# Patient Record
Sex: Male | Born: 1980 | Race: Black or African American | Hispanic: No | Marital: Single | Smoking: Current some day smoker
Health system: Southern US, Community
[De-identification: ages and names within clinical notes are randomized; demographics above are authoritative.]

---

## 2005-01-11 ENCOUNTER — Emergency Department (HOSPITAL_COMMUNITY): Admission: EM | Admit: 2005-01-11 | Discharge: 2005-01-11 | Payer: Self-pay | Admitting: Family Medicine

## 2007-06-07 ENCOUNTER — Ambulatory Visit (HOSPITAL_COMMUNITY): Admission: RE | Admit: 2007-06-07 | Discharge: 2007-06-07 | Payer: Self-pay | Admitting: Obstetrics and Gynecology

## 2008-02-13 ENCOUNTER — Emergency Department (HOSPITAL_COMMUNITY): Admission: EM | Admit: 2008-02-13 | Discharge: 2008-02-13 | Payer: Self-pay | Admitting: Emergency Medicine

## 2008-11-17 ENCOUNTER — Emergency Department (HOSPITAL_BASED_OUTPATIENT_CLINIC_OR_DEPARTMENT_OTHER): Admission: EM | Admit: 2008-11-17 | Discharge: 2008-11-17 | Payer: Self-pay | Admitting: Emergency Medicine

## 2008-12-25 ENCOUNTER — Ambulatory Visit: Payer: Self-pay | Admitting: Internal Medicine

## 2008-12-25 DIAGNOSIS — J309 Allergic rhinitis, unspecified: Secondary | ICD-10-CM | POA: Insufficient documentation

## 2008-12-25 DIAGNOSIS — R030 Elevated blood-pressure reading, without diagnosis of hypertension: Secondary | ICD-10-CM | POA: Insufficient documentation

## 2009-01-30 ENCOUNTER — Telehealth: Payer: Self-pay | Admitting: Internal Medicine

## 2009-12-05 ENCOUNTER — Emergency Department (HOSPITAL_BASED_OUTPATIENT_CLINIC_OR_DEPARTMENT_OTHER): Admission: EM | Admit: 2009-12-05 | Discharge: 2009-12-05 | Payer: Self-pay | Admitting: Emergency Medicine

## 2010-10-20 NOTE — Assessment & Plan Note (Signed)
Summary: new to be est.-jr   Vital Signs:  Patient profile:   30 year old male Height:      69 inches Weight:      190.75 pounds BMI:     28.27 Temp:     97.8 degrees F oral Pulse rate:   76 / minute Pulse rhythm:   regular Resp:     16 per minute BP sitting:   130 / 80  (left arm) Cuff size:   large  Vitals Entered By: Glendell Docker CMA (December 25, 2008 9:33 AM)  Primary Care Provider:  Dondra Spry DO   History of Present Illness: 30 y/o AA male to establish and for CPX.   He denies hx of major illness or hospitalization.  He has hx of allergies.  He is experiencing exacerbation with nasal congestion and irritated eyes.   He also has hx of chlaymdia.   He was treated.   He is not sure if he was ever tested for HIV.  Preventive Screening-Counseling & Management     Alcohol drinks/day: >5- weekend     Alcohol type: beer     Alcohol Counseling: to decrease amount and/or frequency of alcohol intake     Smoking Status: current     Packs/Day: <0.25     Year Started: 1996     Tobacco Counseling: to quit use of tobacco products     Caffeine use/day: 3 beverages daily     Caffeine Counseling: decrease use of caffeine     Does Patient Exercise: no  Allergies (verified): No Known Drug Allergies  Past History:  Past Medical History:    Allergic rhinitis    Hx of chlamydia   Family History:    Mother died of AIDS age 78    CAD - no    DM - No    Family History Hypertension    Cancer - no  Social History:    Occupation: Midwife [employer]    Single    1 daughter 4y/o    Caffeine use/day:  3 beverages daily    Smoking Status:  current    Packs/Day:  <0.25    Does Patient Exercise:  no  Physical Exam  General:  alert, well-developed, and well-nourished.   Head:  normocephalic and atraumatic.   Eyes:  vision grossly intact, pupils equal, pupils round, and pupils reactive to light.   Ears:  R ear normal and L ear normal.   Nose:  nasal  dischargemucosal pallor and mucosal edema.   Mouth:  Oral mucosa and oropharynx without lesions or exudates.  Teeth in good repair. Neck:  No deformities, masses, or tenderness noted. Lungs:  Normal respiratory effort, chest expands symmetrically. Lungs are clear to auscultation, no crackles or wheezes. Heart:  normal rate, regular rhythm, no murmur, and no gallop.   Abdomen:  soft, non-tender, no hepatomegaly, and no splenomegaly.   Genitalia:  no scrotal masses, no cutaneous lesions, and no urethral discharge.   Extremities:  No clubbing, cyanosis, edema, or deformity noted with normal full range of motion of all joints.   Neurologic:  cranial nerves II-XII intact and gait normal.   Psych:  normally interactive, good eye contact, not anxious appearing, and not depressed appearing.     Impression & Recommendations:  Problem # 1:  HEALTH MAINTENANCE EXAM (ICD-V70.0) Reviewed adult health maintenance protocols.  He has hx of chlamydia.   I suggest HIV screen.  Problem # 2:  ALLERGIC  RHINITIS (ICD-477.9)  His updated medication list for this problem includes:    Nasonex 50 Mcg/act Susp (Mometasone furoate) .Marland Kitchen... 2 sprays each nostril  Discussed use of allergy medications and environmental measures.   Complete Medication List: 1)  Nasonex 50 Mcg/act Susp (Mometasone furoate) .... 2 sprays each nostril  Patient Instructions: 1)  Please schedule a follow-up appointment in 2 months. 2)  Limit your Sodium (Salt). 3)  It is important that you exercise regularly at least 30 minutes 5 times a week. 4)  If you develop chest pain, have severe difficulty breathing, or feel very tired , stop exercising immediately and seek medical attention. 5)  BMP prior to visit, ICD-9:  6)  Hepatic Panel prior to visit, ICD-9:  7)  Lipid Panel prior to visit, ICD-9: 8)  TSH prior to visit, ICD-9: 9)  CBC w/ Diff prior to visit, ICD-9: 10)  HIV ELISA:  v70 Prescriptions: NASONEX 50 MCG/ACT SUSP  (MOMETASONE FUROATE) 2 sprays each nostril  #1 x 2   Entered and Authorized by:   D. Thomos Lemons DO   Signed by:   D. Thomos Lemons DO on 12/25/2008   Method used:   Electronically to        [facility]  [address] [phone]* (retail)       [address]       [place], [place]  [address]       Ph: [phone] or [phone]       Fax: [phone]   RxID:   [id number]

## 2010-10-20 NOTE — Progress Notes (Signed)
  Phone Note Call from Patient   Reason for Call: Talk to Nurse, Talk to Doctor Summary of Call: Pt. 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 He states he was just here and had a physical a month ago but needs someone to feel out DOT papers, so he can pick up those papers.  Can you feel this out? Call pt. 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   Follow-up by: D. Thomos Lemons DO,  Jan 30, 2009 1:12 PM  Additional Follow-up for Phone Call Additional follow up Details #1::        Called pt. to inform him that he would have to go to [facility]. Additional Follow-up by: Michaelle Copas,  Jan 30, 2009 2:59 PM

## 2010-12-14 LAB — URINALYSIS, ROUTINE W REFLEX MICROSCOPIC
Bilirubin Urine: NEGATIVE
Glucose, UA: NEGATIVE mg/dL
Hgb urine dipstick: NEGATIVE
Ketones, ur: NEGATIVE mg/dL
Nitrite: NEGATIVE
Protein, ur: NEGATIVE mg/dL
Specific Gravity, Urine: 1.014 (ref 1.005–1.030)
Urobilinogen, UA: 1 mg/dL (ref 0.0–1.0)
pH: 6.5 (ref 5.0–8.0)

## 2010-12-14 LAB — URINE CULTURE
Colony Count: NO GROWTH
Culture: NO GROWTH

## 2010-12-14 LAB — URINE MICROSCOPIC-ADD ON

## 2010-12-14 LAB — GONOCOCCUS DNA, PCR: GC Probe Amp, Genital: POSITIVE — AB

## 2011-06-16 LAB — GC/CHLAMYDIA PROBE AMP, GENITAL
Chlamydia, DNA Probe: POSITIVE — AB
GC Probe Amp, Genital: POSITIVE — AB

## 2011-06-16 LAB — RPR: RPR Ser Ql: NONREACTIVE

## 2011-10-06 ENCOUNTER — Emergency Department (HOSPITAL_BASED_OUTPATIENT_CLINIC_OR_DEPARTMENT_OTHER)
Admission: EM | Admit: 2011-10-06 | Discharge: 2011-10-06 | Disposition: A | Discharge status: Home / Self Care | Payer: PRIVATE HEALTH INSURANCE | Attending: Emergency Medicine | Admitting: Emergency Medicine

## 2011-10-06 ENCOUNTER — Encounter (HOSPITAL_BASED_OUTPATIENT_CLINIC_OR_DEPARTMENT_OTHER): Payer: Self-pay | Admitting: Emergency Medicine

## 2011-10-06 DIAGNOSIS — A088 Other specified intestinal infections: Secondary | ICD-10-CM | POA: Insufficient documentation

## 2011-10-06 DIAGNOSIS — K2970 Gastritis, unspecified, without bleeding: Secondary | ICD-10-CM

## 2011-10-06 DIAGNOSIS — R197 Diarrhea, unspecified: Secondary | ICD-10-CM | POA: Insufficient documentation

## 2011-10-06 DIAGNOSIS — A084 Viral intestinal infection, unspecified: Secondary | ICD-10-CM

## 2011-10-06 DIAGNOSIS — R109 Unspecified abdominal pain: Secondary | ICD-10-CM | POA: Insufficient documentation

## 2011-10-06 MED ORDER — GI COCKTAIL ~~LOC~~
30.0000 mL | Freq: Once | ORAL | Status: AC
  Administered 2011-10-06: 30 mL via ORAL
  Filled 2011-10-06: qty 30

## 2011-10-06 MED ORDER — ONDANSETRON 4 MG PO TBDP
4.0000 mg | ORAL_TABLET | Freq: Once | ORAL | Status: AC
  Administered 2011-10-06: 4 mg via ORAL
  Filled 2011-10-06: qty 1

## 2011-10-06 MED ORDER — FAMOTIDINE 20 MG PO TABS
20.0000 mg | ORAL_TABLET | Freq: Once | ORAL | Status: AC
  Administered 2011-10-06: 20 mg via ORAL
  Filled 2011-10-06: qty 1

## 2011-10-06 MED ORDER — FAMOTIDINE 20 MG PO TABS: 20.0000 mg | ORAL_TABLET | Freq: Two times a day (BID) | ORAL | Status: DC

## 2011-10-06 NOTE — ED Provider Notes (Signed)
History     CSN: [id number]  Arrival date & time 10/06/11  1478   First MD Initiated Contact with Patient 10/06/11 [phone]      Chief Complaint  Patient presents with  . Abdominal Pain  . Diarrhea    (Consider location/radiation/quality/duration/timing/severity/associated sxs/prior treatment) Patient is a 31 y.o. male presenting with abdominal pain. The history is provided by the patient. No language interpreter was used.  Abdominal Pain The primary symptoms of the illness include abdominal pain, nausea, vomiting and diarrhea. The primary symptoms of the illness do not include fever, fatigue, shortness of breath, hematemesis, hematochezia or dysuria. The current episode started yesterday. The onset of the illness was gradual. The problem has not changed since onset. The abdominal pain began 3 to 5 hours ago. The pain came on gradually. The abdominal pain has been gradually worsening since its onset. The abdominal pain is located in the epigastric region. The abdominal pain does not radiate. The abdominal pain is relieved by nothing.  The vomiting began yesterday. Vomiting occurred once. The emesis contains stomach contents.  The diarrhea began today. The diarrhea is watery. The diarrhea occurs 2 to 4 times per day.  Symptoms associated with the illness do not include chills, constipation, urgency or frequency.    History reviewed. No pertinent past medical history.  History reviewed. No pertinent past surgical history.  No family history on file.  History  Substance Use Topics  . Smoking status: Current Everyday Smoker  . Smokeless tobacco: Not on file  . Alcohol Use: Yes      Review of Systems  Constitutional: Negative for fever, chills, activity change, appetite change and fatigue.  HENT: Negative for congestion, sore throat, rhinorrhea, neck pain and neck stiffness.   Respiratory: Negative for cough and shortness of breath.   Cardiovascular: Negative for chest pain and  palpitations.  Gastrointestinal: Positive for nausea, vomiting, abdominal pain and diarrhea. Negative for constipation, blood in stool, hematochezia and hematemesis.  Genitourinary: Negative for dysuria, urgency, frequency and flank pain.  Neurological: Negative for dizziness, weakness, light-headedness, numbness and headaches.  All other systems reviewed and are negative.    Allergies  Review of patient's allergies indicates no known allergies.  Home Medications   Current Outpatient Rx  Name Route Sig Dispense Refill  . FAMOTIDINE 20 MG PO TABS Oral Take 1 tablet (20 mg total) by mouth 2 (two) times daily. 30 tablet 0    BP 136/71  Pulse 92  Temp(Src) 98.6 F (37 C) (Oral)  Resp 18  SpO2 99%  Physical Exam  Nursing note and vitals reviewed. Constitutional: He is oriented to person, place, and time. He appears well-developed and well-nourished. No distress.  HENT:  Head: Normocephalic and atraumatic.  Mouth/Throat: Oropharynx is clear and moist.  Eyes: Conjunctivae and EOM are normal. Pupils are equal, round, and reactive to light.  Neck: Normal range of motion. Neck supple.  Cardiovascular: Normal rate, regular rhythm, normal heart sounds and intact distal pulses.  Exam reveals no gallop and no friction rub.   No murmur heard. Pulmonary/Chest: Effort normal and breath sounds normal. No respiratory distress.  Abdominal: Soft. Bowel sounds are normal. There is no tenderness. There is no rebound and no guarding.  Musculoskeletal: Normal range of motion. He exhibits no tenderness.  Neurological: He is alert and oriented to person, place, and time. No cranial nerve deficit.  Skin: Skin is warm and dry. No rash noted.    ED Course  Procedures (including critical care time)  Labs Reviewed - No data to display No results found.   1. Viral gastroenteritis   2. Gastritis       MDM  The patient was able tolerate by mouth via oral challenge one emergency department. He  was able to tolerate Zofran ODT, Pepcid but an entire glass of water and a GI cocktail without episode of emesis. This time I feel he is safe for discharge to home to continue oral hydration at home. Instructed him to take over-the-counter Imodium as needed for his diarrhea. There is no indication for blood work at this time as I have no suspicion for intra-abdominal process. I feel his epigastric pain is secondary to gastritis from vomiting.  Instructed to follow up with his pcp and provided sx for which to return        Dayton Bailiff, MD 10/06/11 [phone]

## 2011-10-06 NOTE — ED Notes (Signed)
Pt reports vomiting x 1 episode yesterday with diarrhea persisting until 1 am. Pt now c/o epigastric pain.

## 2012-01-22 ENCOUNTER — Emergency Department (INDEPENDENT_AMBULATORY_CARE_PROVIDER_SITE_OTHER): Payer: Self-pay

## 2012-01-22 ENCOUNTER — Encounter (HOSPITAL_BASED_OUTPATIENT_CLINIC_OR_DEPARTMENT_OTHER): Payer: Self-pay | Admitting: *Deleted

## 2012-01-22 ENCOUNTER — Emergency Department (HOSPITAL_BASED_OUTPATIENT_CLINIC_OR_DEPARTMENT_OTHER)
Admission: EM | Admit: 2012-01-22 | Discharge: 2012-01-22 | Disposition: A | Discharge status: Home / Self Care | Payer: Self-pay | Attending: Emergency Medicine | Admitting: Emergency Medicine

## 2012-01-22 DIAGNOSIS — R109 Unspecified abdominal pain: Secondary | ICD-10-CM

## 2012-01-22 DIAGNOSIS — F17200 Nicotine dependence, unspecified, uncomplicated: Secondary | ICD-10-CM | POA: Insufficient documentation

## 2012-01-22 DIAGNOSIS — M549 Dorsalgia, unspecified: Secondary | ICD-10-CM | POA: Insufficient documentation

## 2012-01-22 LAB — URINALYSIS, ROUTINE W REFLEX MICROSCOPIC
Bilirubin Urine: NEGATIVE
Glucose, UA: NEGATIVE mg/dL
Hgb urine dipstick: NEGATIVE
Ketones, ur: NEGATIVE mg/dL
Leukocytes, UA: NEGATIVE
Nitrite: NEGATIVE
Protein, ur: NEGATIVE mg/dL
Specific Gravity, Urine: 1.021 (ref 1.005–1.030)
Urobilinogen, UA: 1 mg/dL (ref 0.0–1.0)
pH: 5.5 (ref 5.0–8.0)

## 2012-01-22 MED ORDER — KETOROLAC TROMETHAMINE 60 MG/2ML IM SOLN
60.0000 mg | Freq: Once | INTRAMUSCULAR | Status: AC
  Administered 2012-01-22: 60 mg via INTRAMUSCULAR
  Filled 2012-01-22: qty 2

## 2012-01-22 MED ORDER — HYDROCODONE-ACETAMINOPHEN 5-500 MG PO TABS: 1.0000 | ORAL_TABLET | Freq: Four times a day (QID) | ORAL | Status: AC | PRN

## 2012-01-22 NOTE — Discharge Instructions (Signed)
Flank Pain Flank pain refers to pain that is located on the side of the body between the upper abdomen and the back. It can be caused by many things. CAUSES  Some of the more common causes of flank pain include:  Muscle strain.   Muscle spasms.   A disease of your spine (vertebral disk disease).   A lung infection (pneumonia).   Fluid around your lungs (pulmonary edema).   A kidney infection.   Kidney stones.   A very painful skin rash on only one side of your body (shingles).   Gallbladder disease.  DIAGNOSIS  Blood tests, urine tests, and X-rays may help your caregiver determine what is wrong. TREATMENT  The treatment of pain depends on the cause. Your caregiver will determine what treatment will work best for you. HOME CARE INSTRUCTIONS   Home care will depend on the cause of your pain.   Some medications may help relieve the pain. Take medication for relief of pain as directed by your caregiver.   Tell your caregiver about any changes in your pain.   Follow up with your caregiver.  SEEK IMMEDIATE MEDICAL CARE IF:   Your pain is not controlled with medication.   The pain increases.   You have abdominal pain.   You have shortness of breath.   You have persistent nausea or vomiting.   You have swelling in your abdomen.   You feel faint or pass out.   You have a temperature by mouth above 102 F (38.9 C), not controlled by medicine.  MAKE SURE YOU:   Understand these instructions.   Will watch your condition.   Will get help right away if you are not doing well or get worse.  Document Released: 10/28/2005 Document Revised: 08/26/2011 Document Reviewed: 02/21/2010 Exit[employer] Patient Information 2012 [employer]. 

## 2012-01-22 NOTE — ED Provider Notes (Signed)
History     CSN: [id number]  Arrival date & time 01/22/12  0944   First MD Initiated Contact with Patient 01/22/12 1012      Chief Complaint  Patient presents with  . Back Pain    (Consider location/radiation/quality/duration/timing/severity/associated sxs/prior treatment) Patient is a 31 y.o. male presenting with back pain. The history is provided by the patient.  Back Pain  This is a new problem. Episode onset: yesterday morning. The problem occurs constantly. The problem has been gradually worsening. The pain is associated with no known injury. Pain location: left flank. The quality of the pain is described as cramping. The pain does not radiate. The pain is severe. The pain is the same all the time.    History reviewed. No pertinent past medical history.  History reviewed. No pertinent past surgical history.  No family history on file.  History  Substance Use Topics  . Smoking status: Current Everyday Smoker  . Smokeless tobacco: Not on file  . Alcohol Use: Yes      Review of Systems  Musculoskeletal: Positive for back pain.  All other systems reviewed and are negative.    Allergies  Review of patient's allergies indicates no known allergies.  Home Medications  No current outpatient prescriptions on file.  BP 141/72  Pulse 71  Temp 98.2 F (36.8 C)  Resp 18  SpO2 100%  Physical Exam  Nursing note and vitals reviewed. Constitutional: He is oriented to person, place, and time. He appears well-developed and well-nourished. No distress.  HENT:  Head: Normocephalic and atraumatic.  Neck: Normal range of motion. Neck supple.  Abdominal: Soft. Bowel sounds are normal.  Musculoskeletal: Normal range of motion.  Neurological: He is alert and oriented to person, place, and time.       Strength is 5/5 in ble.  Ambulatory without difficulty.  Skin: Skin is warm and dry. He is not diaphoretic.    ED Course  Procedures (including critical care time)   Labs  Reviewed  URINALYSIS, ROUTINE W REFLEX MICROSCOPIC   No results found.   No diagnosis found.    MDM  History sounded like a stone, but the ua and ct did not.  Will discharge with nsaids, pain meds.  Return prn.        Geoffery Lyons, MD 01/22/12 1054

## 2012-01-22 NOTE — ED Notes (Addendum)
Patient c/o R flank pain that started hurting yesterday morning, took ibuprofen yesterday, no relief, no injury, no n/v, no issues urinating

## 2013-02-14 ENCOUNTER — Encounter (HOSPITAL_COMMUNITY): Payer: Self-pay | Admitting: *Deleted

## 2013-02-14 ENCOUNTER — Emergency Department (HOSPITAL_COMMUNITY)
Admission: EM | Admit: 2013-02-14 | Discharge: 2013-02-14 | Disposition: A | Discharge status: Home / Self Care | Payer: BC Managed Care – PPO | Source: Home / Self Care

## 2013-02-14 DIAGNOSIS — S6000XA Contusion of unspecified finger without damage to nail, initial encounter: Secondary | ICD-10-CM

## 2013-02-14 DIAGNOSIS — S60111A Contusion of right thumb with damage to nail, initial encounter: Secondary | ICD-10-CM

## 2013-02-14 DIAGNOSIS — S6010XA Contusion of unspecified finger with damage to nail, initial encounter: Secondary | ICD-10-CM

## 2013-02-14 NOTE — ED Notes (Signed)
Pt  Reports   He   inj  His  r   Thumb     3  Weeks  Ago  He  Slammed a  Engineer, manufacturing  On it  It  Is  Not healing   He  Has  Nail  Bed  Involvement       He  Has  Not  seeken  tx for this  Injury  Yet

## 2013-02-14 NOTE — ED Provider Notes (Signed)
History     CSN: [id number]  Arrival date & time 02/14/13  1104   None     Chief Complaint  Patient presents with  . Finger Injury    (Consider location/radiation/quality/duration/timing/severity/associated sxs/prior treatment) HPI Comments: Approximately 3 weeks ago this 32 year old male accidentally slammed the car door on his left thumb. The contact point was at the base of the nail. This resulted in a subungual hematoma and a small chip off the proximal nail with in the lunula. He states there was initial pain however the pain subsided in a couple days. He has had full function and range of motion of the thumb without swelling or other limitations.   History reviewed. No pertinent past medical history.  History reviewed. No pertinent past surgical history.  History reviewed. No pertinent family history.  History  Substance Use Topics  . Smoking status: Current Every Day Smoker  . Smokeless tobacco: Not on file  . Alcohol Use: Yes      Review of Systems  Constitutional: Negative.   Respiratory: Negative.   Gastrointestinal: Negative.   Genitourinary: Negative.   Musculoskeletal: Negative for back pain, joint swelling and arthralgias.       As per HPI  Skin: Positive for wound.       As per history of present illness  Neurological: Negative for numbness.  Hematological: Does not bruise/bleed easily.  Psychiatric/Behavioral: Negative.     Allergies  Review of patient's allergies indicates no known allergies.  Home Medications  No current outpatient prescriptions on file.  BP 136/85  Pulse 62  Temp(Src) 98.4 F (36.9 C) (Oral)  Resp 18  SpO2 99%  Physical Exam  Nursing note and vitals reviewed. Constitutional: He is oriented to person, place, and time. He appears well-developed and well-nourished. No distress.  Pulmonary/Chest: Effort normal. No respiratory distress.  Musculoskeletal: Normal range of motion. He exhibits no edema and no tenderness.  The  thumb and thenar eminence and hand are nontender. He has full-strength in the palm with normal opposition full flexion and extension against resistance. No tenderness in the IP or MCP or phalanges. No swelling or  limitations in utility. This is essentially a normal musculoskeletal exam of the right thumb.  Neurological: He is alert and oriented to person, place, and time. He exhibits normal muscle tone.  Skin: Skin is warm and dry.  The nail of the right thumb is completely attached to the nail bed. There are no areas in which it can be lifted. The small chipped portion of the nail of the lunula is healing well. The subungual hematoma involves the entire nail. No other discoloration, signs of infection, drainage.  Psychiatric: He has a normal mood and affect.    ED Course  Procedures (including critical care time)  Labs Reviewed - No data to display No results found.   1. Finger nail contusion, initial encounter   2. Subungual hematoma of right thumb, initial encounter       MDM  With The nail completely attached to the nailbed, no signs of infection or drainage and No pain or tenderness probably the best action is to leave the nail attached as he is and allow it to heal without further intervention. I suspect he will be able to keep the nail, however, if he does start to lose the nail he can simply pulled off as the new nail grows beneath the current one.        Hayden Rasmussen, NP 02/14/13 1302  Onalee Hua  Jordany Russett, NP 02/14/13 1526

## 2013-02-15 NOTE — ED Provider Notes (Signed)
Medical screening examination/treatment/procedure(s) were performed by non-physician practitioner and as supervising physician I was immediately available for consultation/collaboration.   MORENO-COLL,Donni Oglesby; MD  Kemauri Musa Moreno-Coll, MD 02/15/13 1018 

## 2014-03-04 ENCOUNTER — Emergency Department (HOSPITAL_BASED_OUTPATIENT_CLINIC_OR_DEPARTMENT_OTHER)
Admission: EM | Admit: 2014-03-04 | Discharge: 2014-03-04 | Disposition: A | Discharge status: Home / Self Care | Payer: BC Managed Care – PPO | Attending: Emergency Medicine | Admitting: Emergency Medicine

## 2014-03-04 ENCOUNTER — Encounter (HOSPITAL_BASED_OUTPATIENT_CLINIC_OR_DEPARTMENT_OTHER): Payer: Self-pay | Admitting: Emergency Medicine

## 2014-03-04 DIAGNOSIS — F17200 Nicotine dependence, unspecified, uncomplicated: Secondary | ICD-10-CM | POA: Insufficient documentation

## 2014-03-04 DIAGNOSIS — J029 Acute pharyngitis, unspecified: Secondary | ICD-10-CM | POA: Insufficient documentation

## 2014-03-04 LAB — RAPID STREP SCREEN (MED CTR MEBANE ONLY): Streptococcus, Group A Screen (Direct): NEGATIVE

## 2014-03-04 MED ORDER — DEXAMETHASONE 4 MG PO TABS
10.0000 mg | ORAL_TABLET | Freq: Once | ORAL | Status: AC
  Administered 2014-03-04: 10 mg via ORAL

## 2014-03-04 MED ORDER — IBUPROFEN 800 MG PO TABS: 800.0000 mg | ORAL_TABLET | Freq: Three times a day (TID) | ORAL | Status: DC | PRN

## 2014-03-04 MED ORDER — IBUPROFEN 800 MG PO TABS
800.0000 mg | ORAL_TABLET | Freq: Once | ORAL | Status: AC
  Administered 2014-03-04: 800 mg via ORAL
  Filled 2014-03-04: qty 1

## 2014-03-04 MED ORDER — DEXAMETHASONE 4 MG PO TABS
ORAL_TABLET | ORAL | Status: AC
  Filled 2014-03-04: qty 3

## 2014-03-04 NOTE — ED Provider Notes (Signed)
CSN: [id number]     Arrival date & time 03/04/14  1115 History   First MD Initiated Contact with Patient 03/04/14 1125     Chief Complaint  Patient presents with  . Sore Throat     (Consider location/radiation/quality/duration/timing/severity/associated sxs/prior Treatment) Patient is a 33 y.o. male presenting with pharyngitis. The history is provided by the patient.  Sore Throat This is a new problem. The current episode started more than 2 days ago. The problem occurs constantly. The problem has been gradually worsening. Associated symptoms include chest pain (sharp this morning, intermittent). Pertinent negatives include no abdominal pain, no headaches and no shortness of breath. Nothing aggravates the symptoms. Nothing relieves the symptoms.    History reviewed. No pertinent past medical history. History reviewed. No pertinent past surgical history. No family history on file. History  Substance Use Topics  . Smoking status: Current Every Day Smoker  . Smokeless tobacco: Not on file  . Alcohol Use: Yes    Review of Systems  Constitutional: Negative for fever and chills.  Respiratory: Negative for shortness of breath.   Cardiovascular: Positive for chest pain (sharp this morning, intermittent).  Gastrointestinal: Negative for abdominal pain.  Neurological: Negative for headaches.  All other systems reviewed and are negative.     Allergies  Review of patient's allergies indicates no known allergies.  Home Medications   Prior to Admission medications   Not on File   BP 140/88  Pulse 74  Temp(Src) 98.5 F (36.9 C) (Oral)  Resp 16  Ht 5\' 9"  (1.753 m)  Wt 205 lb (92.987 kg)  BMI 30.26 kg/m2  SpO2 100% Physical Exam  Nursing note and vitals reviewed. Constitutional: He is oriented to person, place, and time. He appears well-developed and well-nourished. No distress.  HENT:  Head: Normocephalic and atraumatic.  Mouth/Throat: Posterior oropharyngeal erythema (mild)  present. No oropharyngeal exudate, posterior oropharyngeal edema or tonsillar abscesses.  Eyes: EOM are normal. Pupils are equal, round, and reactive to light.  Neck: Normal range of motion. Neck supple.  Cardiovascular: Normal rate and regular rhythm.  Exam reveals no friction rub.   No murmur heard. Pulmonary/Chest: Effort normal and breath sounds normal. No respiratory distress. He has no wheezes. He has no rales.  Abdominal: Soft. He exhibits no distension. There is no tenderness. There is no rebound.  Musculoskeletal: Normal range of motion. He exhibits no edema.  Neurological: He is alert and oriented to person, place, and time. No cranial nerve deficit. He exhibits normal muscle tone. Coordination normal.  Skin: Skin is warm. No rash noted. He is not diaphoretic.    ED Course  Procedures (including critical care time) Labs Review Labs Reviewed  RAPID STREP SCREEN  CULTURE, GROUP A STREP    Imaging Review No results found.   EKG Interpretation None      MDM   Final diagnoses:  Pharyngitis    78M presents with sore throat. Occasional cough. No fevers, rhinorrhea, or nasal congestion. Associated dysphagia. Mild redness in posterior throat, no exudates, no tonsillar edema. No signs of RPA, PTA.  Strep negative, given supportive care instructions for his viral pharyngitis.  Dagmar HaitWilliam Terran Hollenkamp, MD 03/04/14 [phone]

## 2014-03-04 NOTE — ED Notes (Signed)
C/o sore throat x 3-4 days

## 2014-03-04 NOTE — Discharge Instructions (Signed)
Pharyngitis Pharyngitis is redness, pain, and swelling (inflammation) of your pharynx.  CAUSES  Pharyngitis is usually caused by infection. Most of the time, these infections are from viruses (viral) and are part of a cold. However, sometimes pharyngitis is caused by bacteria (bacterial). Pharyngitis can also be caused by allergies. Viral pharyngitis may be spread from person to person by coughing, sneezing, and personal items or utensils (cups, forks, spoons, toothbrushes). Bacterial pharyngitis may be spread from person to person by more intimate contact, such as kissing.  SIGNS AND SYMPTOMS  Symptoms of pharyngitis include:   Sore throat.   Tiredness (fatigue).   Low-grade fever.   Headache.  Joint pain and muscle aches.  Skin rashes.  Swollen lymph nodes.  Plaque-like film on throat or tonsils (often seen with bacterial pharyngitis). DIAGNOSIS  Your health care provider will ask you questions about your illness and your symptoms. Your medical history, along with a physical exam, is often all that is needed to diagnose pharyngitis. Sometimes, a rapid strep test is done. Other lab tests may also be done, depending on the suspected cause.  TREATMENT  Viral pharyngitis will usually get better in 3 4 days without the use of medicine. Bacterial pharyngitis is treated with medicines that kill germs (antibiotics).  HOME CARE INSTRUCTIONS   Drink enough water and fluids to keep your urine clear or pale yellow.   Only take over-the-counter or prescription medicines as directed by your health care provider:   If you are prescribed antibiotics, make sure you finish them even if you start to feel better.   Do not take aspirin.   Get lots of rest.   Gargle with 8 oz of salt water ( tsp of salt per 1 qt of water) as often as every 1 2 hours to soothe your throat.   Throat lozenges (if you are not at risk for choking) or sprays may be used to soothe your throat. SEEK MEDICAL  CARE IF:   You have large, tender lumps in your neck.  You have a rash.  You cough up green, yellow-brown, or bloody spit. SEEK IMMEDIATE MEDICAL CARE IF:   Your neck becomes stiff.  You drool or are unable to swallow liquids.  You vomit or are unable to keep medicines or liquids down.  You have severe pain that does not go away with the use of recommended medicines.  You have trouble breathing (not caused by a stuffy nose). MAKE SURE YOU:   Understand these instructions.  Will watch your condition.  Will get help right away if you are not doing well or get worse. Document Released: 09/06/2005 Document Revised: 06/27/2013 Document Reviewed: 05/14/2013 [facility] Patient Information 2014 [employer], [place].   Emergency Department Resource Guide 1) Find a Doctor and Pay Out of Pocket Although you won't have to find out who is covered by your insurance plan, it is a good idea to ask around and get recommendations. You will then need to call the office and see if the doctor you have chosen will accept you as a new patient and what types of options they offer for patients who are self-pay. Some doctors offer discounts or will set up payment plans for their patients who do not have insurance, but you will need to ask so you aren't surprised when you get to your appointment.  2) Contact Your Local Health Department Not all health departments have doctors that can see patients for sick visits, but many do, so it is worth a  call to see if yours does. If you don't know where your local health department is, you can check in your phone book. The CDC also has a tool to help you locate your state's health department, and many state websites also have listings of all of their local health departments.  3) Find a Walk-in Clinic If your illness is not likely to be very severe or complicated, you may want to try a walk in clinic. These are popping up all over the country in pharmacies, drugstores, and  shopping centers. They're usually staffed by nurse practitioners or physician assistants that have been trained to treat common illnesses and complaints. They're usually fairly quick and inexpensive. However, if you have serious medical issues or chronic medical problems, these are probably not your best option.  No Primary Care Doctor: - Call Health Connect at  [phone] - they can help you locate a primary care doctor that  accepts your insurance, provides certain services, etc. - Physician Referral Service- [phone]  Chronic Pain Problems: Organization         Address  Phone   Notes  [facility]  [phone]) [phone] Patients need to be referred by their primary care doctor.   Medication Assistance: Organization         Address  Phone   Notes  [facility] Medication [facility] [address] [place], [place] [address] [phone]) [phone] --Must be a resident of [place] -- Must have NO insurance coverage whatsoever (no Medicaid/ Medicare, etc.) -- The pt. MUST have a primary care doctor that directs their care regularly and follows them in the community   MedAssist  [phone]   [employer]  [phone]    Agencies that provide inexpensive medical care: Organization         Address  Phone   Notes  [facility]  [phone]   [facility]    [phone]   [facility] [address] [place], [place] [address] [phone]   [facility] [place] [address], [place] [phone]   Planned Parenthood    [phone]   [facility]    [phone]   [facility]  [address], [place] Phone:  [phone]) [phone], Fax:  [phone] Hours of Operation:  9 am - 6 pm, M-F.  Also accepts Medicaid/Medicare and self-pay.  [facility]  [address], [place] Phone: [phone],  Fax: [phone]. Hours of Operation:  8:30 am - 5:30 pm, M-F.  Also accepts Medicaid and self-pay.  [facility] [place] [address], [place] Phone: [phone]   [facility] [address], [place], [place] [phone], Ext. [phone] Mondays & Thursdays: 7-9 AM.  First 15 patients are seen on a first come, first serve basis.    Medicaid-accepting [facility] Providers:  Organization         Address  Phone   Notes  [facility] [address], [place] [phone]) [phone] Also accepts self-pay patients.  [facility] [address], [place]  [phone]) [phone]   [facility] [address], [place] [phone]   [facility] [address] [place] [address], [place] [phone]) [phone]   Renaye RakersVeita Bland [address], [place] [address],  [place]   [phone] Only accepts [place] Goldman Sachsccess Medicaid patients after they have their name applied to their card.   Self-Pay (no insurance) in [facility] [place]:  Organization         Address  Phone   Notes  Sickle Cell Patients, [facility] [address], [place] [phone]   [facility] [address], [place] [phone]   [facility] [place]  [address], [place] [phone]   [facility]/Dr. Osei-Bonsu  [address] [place] [address], [place] or [phone], [place] [phone] Phone number for both [place] and [place] locations is the same.  [facility] [address], [place] [phone]) [phone]   [facility] [address] [place] [address], [place] or [address] [phone]) [phone]   [facility] [address], [place] [phone](336) [phone], phone; [phone], fax Sees patients 1st and 3rd Saturday of every month.  Must not qualify for public or private  insurance (i.e. Medicaid, Medicare, [place] Health Choice, Veterans' Benefits)  Household income should be no more than 200% of the poverty level The clinic cannot treat you if you are pregnant or think you are pregnant  Sexually transmitted diseases are not treated at the clinic.    Dental Care: Organization         Address  Phone  Notes  [facility] of [facility] [address], [place] [phone]) [phone] Accepts children up to age 31 who are enrolled in [place]iana[place]icaid or [place] Health Choice; pregnant women with a Medicaid card; and children who have applied for Medicaid or [place] Health Choice, but were declined, whose parents can pay a reduced fee at time of service.  [facility] Department of [facility] [place]  [address], [place] [phone]) [phone] Accepts children up to age 31 who are enrolled in [place]icaid or [place] Health Choice; pregnant women with a Medicaid card; and children who have applied for Medicaid or [place] Health Choice, but were declined, whose parents can pay a reduced fee at time of service.  [facility]  [address], [place] [phone] Patients are seen by appointment only. Walk-ins are not accepted. [facility] will see patients 31 years of age and older. Monday - Tuesday (8am-5pm) Most Wednesdays (8:30-5pm) $30 per visit, cash only  [facility]  [address], [facility] [phone] Patients are seen by appointment only. Walk-ins are not accepted. [facility] will see patients 31 years of age and older. One Wednesday Evening (Monthly: Volunteer Based).  $30 per visit, cash only  [employer] [facility] [facility]  [phone] for adults; Children under age 714, call Graduate Pediatric Dentistry at [phone]. Children aged 204-14, please call [phone] to request a pediatric application.  Dental services are provided in all areas of dental care  including fillings, crowns and bridges, complete and partial dentures, implants, gum treatment, root canals, and extractions. Preventive care is also provided. Treatment is provided to both adults and children. Patients are selected via a lottery and there is often a waiting list.   [facility] [address], [place]  [phone] [email or web address]   [facility] [address], [place], [place] [phone], Ext. [phone] Second and Fourth Thursday of each month,  opens at 6:30 AM; Clinic ends at 9 AM.  Patients are seen on a first-come first-served basis, and a limited number are seen during each clinic.   [facility]  [address], [place], [place] [phone]) [phone]   Eligibility Requirements You must have lived in [place], [place], or [place] counties for at least the last three months.   You cannot be eligible for state or federal sponsored National Cityhealthcare insurance, including CIGNAVeterans Administration, [place]Medicaid, or Harrah's EntertainmentMedicare.   You generally cannot be eligible for healthcare insurance through your employer.    How to apply: Eligibility screenings are held every Tuesday and Wednesday afternoon from 1:00 pm until 4:00 pm. You do not need an appointment for the interview!  [facility] [address], [place], [place] [phone]   [facility]  [phone]   [facility]  [phone]   [facility]  [phone]    Behavioral Health Resources in the Community: Intensive Outpatient Programs Organization         Address  Phone  Notes  [facility] [address], [place], [place] [phone]   [facility] [address], [place], [place] [phone]   [facility] Drug [facility] [address], [place], [place]  [phone]   [facility] [address],  [place], [place] [phone] or [phone]     Substance Abuse Resources Organization         Address  Phone  Notes  [facility] Drug Services  [phone]   [facility]  [phone]   [facility]  [phone]   [facility]  [phone]   Residential & Outpatient Substance Abuse Program  [phone]   Psychological Services Organization         Address  Phone  Notes  [facility]  [phone]- [phone]   [facility]  [phone]- [phone]   [facility] [address], [place] [phone] or [phone]    Mobile Crisis Teams Organization         Address  Phone  Notes  [facility], Mobile Crisis Care Unit  [phone]   [facility]  [address] [place], [place] [phone]   Doristine LocksSharon DeEsch [address] [place] [phone]    Self-Help/Support Groups Organization         Address  Phone             Notes  [facility] [place] - variety of support groups  336- [phone] Call for more information  Narcotics Anonymous (NA), [facility] [address], [place]  2 meetings at this location   Statisticianesidential Treatment Programs Organization         Address  Phone  Notes  [facility] [address],    [place]  [phone]   [facility]  [address], [place] [phone], [place], [place] [phone]   [facility] [address], [place] [phone] Admissions: 8am-3pm M-F  [facility] [address]    [place], [place] [phone]   [facility] [address], [place], [place] [phone]   [facility] [address]  [place], [place] [phone]   [facility] - Intensive Outpatient [address], [place], [place] [phone]   [facility] (Addiction Recovery [facility] [address]  [place], [place]  [phone] or [phone]   Residential Treatment  Services (RTS) [address] [place], [place] [phone] Accepts Medicaid  [facility] [address]  [place] [phone] Substance Abuse/Addiction Treatment   [facility] Behavioral Health Resources Organization         Address  Phone  Notes  [facility]  [phone]   Angie FavaJulie Brannon, PhD [address], [address] [place], [place]   [phone] or [phone]) [phone]   [facility]   [address] [place], [place] [phone](336) [phone]   [facility] [address], [place], [place] [phone]) [phone] Insurance/Medicaid/sponsorship through [facility]  [facility]ith [facility] Families [address]                                    [place], [place] [phone]) [phone] Therapy/tele-psych/case  [facility] [address].   [place], [place] [phone]) [phone]    Dr. Lolly MustacheArfeen  [phone]) [phone]   [facility]  [employer] [facility] 1) [address], [place] 2) [address], [place] 3)  [address], [place] [phone]  [phone]) [phone]   [facility] Child Abuse Hotline [phone]) [phone] or [phone](336) [phone] (After Hours)

## 2014-03-06 LAB — CULTURE, GROUP A STREP

## 2017-04-17 ENCOUNTER — Emergency Department (HOSPITAL_BASED_OUTPATIENT_CLINIC_OR_DEPARTMENT_OTHER): Payer: Self-pay

## 2017-04-17 ENCOUNTER — Encounter (HOSPITAL_BASED_OUTPATIENT_CLINIC_OR_DEPARTMENT_OTHER): Payer: Self-pay | Admitting: *Deleted

## 2017-04-17 ENCOUNTER — Emergency Department (HOSPITAL_BASED_OUTPATIENT_CLINIC_OR_DEPARTMENT_OTHER)
Admission: EM | Admit: 2017-04-17 | Discharge: 2017-04-17 | Disposition: A | Discharge status: Home / Self Care | Payer: Self-pay | Attending: Emergency Medicine | Admitting: Emergency Medicine

## 2017-04-17 DIAGNOSIS — W109XXA Fall (on) (from) unspecified stairs and steps, initial encounter: Secondary | ICD-10-CM | POA: Insufficient documentation

## 2017-04-17 DIAGNOSIS — Y929 Unspecified place or not applicable: Secondary | ICD-10-CM | POA: Insufficient documentation

## 2017-04-17 DIAGNOSIS — Y999 Unspecified external cause status: Secondary | ICD-10-CM | POA: Insufficient documentation

## 2017-04-17 DIAGNOSIS — Z87891 Personal history of nicotine dependence: Secondary | ICD-10-CM | POA: Insufficient documentation

## 2017-04-17 DIAGNOSIS — S63501A Unspecified sprain of right wrist, initial encounter: Secondary | ICD-10-CM | POA: Insufficient documentation

## 2017-04-17 DIAGNOSIS — Y9301 Activity, walking, marching and hiking: Secondary | ICD-10-CM | POA: Insufficient documentation

## 2017-04-17 MED ORDER — ACETAMINOPHEN 500 MG PO TABS
1000.0000 mg | ORAL_TABLET | Freq: Once | ORAL | Status: AC
  Administered 2017-04-17: 1000 mg via ORAL
  Filled 2017-04-17: qty 2

## 2017-04-17 NOTE — ED Provider Notes (Signed)
[facility] Note   CSN: [id number] Arrival date & time: 04/17/17  1755  By signing my name below, I, Linna DarnerRussell Turner, attest that this documentation has been prepared under the direction and in the presence of Graciella FreerLindsey Jefferie Holston, PA-C. Electronically Signed: Linna Darnerussell Turner, Scribe. 04/17/2017. 7:50 PM.  History   Chief Complaint Chief Complaint  Patient presents with  . Wrist Injury   The history is provided by the patient. No language interpreter was used.    HPI Comments: Brandon Bowman is a 36 y.o. male who presents to the Emergency Department for evaluation of a right wrist injury sustained s/p mechanical fall yesterday. He fell backwards down some steps and attempted to brace his fall with his outstretched right hand. No head trauma or LOC. He endorses constant right wrist pain radiating into the proximal hand as well as some swelling since the injury occurred. No medications or treatments tried. He has no prior h/o right hand or wrist injuries. Patient denies numbness/tingling, focal weakness, bruising, wounds, or any other associated symptoms.  History reviewed. No pertinent past medical history.  Patient Active Problem List   Diagnosis Date Noted  . ALLERGIC RHINITIS 12/25/2008  . ELEVATED BLOOD PRESSURE WITHOUT DIAGNOSIS OF HYPERTENSION 12/25/2008    History reviewed. No pertinent surgical history.     Home Medications    Prior to Admission medications   Medication Sig Start Date End Date Taking? Authorizing Provider  ibuprofen (ADVIL,MOTRIN) 800 MG tablet Take 1 tablet (800 mg total) by mouth every 8 (eight) hours as needed. 03/04/14   Elwin MochaWalden, Blair, MD    Family History No family history on file.  Social History Social History  Substance Use Topics  . Smoking status: Former Games developermoker  . Smokeless tobacco: Never Used  . Alcohol use Yes     Comment: occ     Allergies   Patient has no known allergies.   Review of Systems Review of  Systems  Musculoskeletal: Positive for arthralgias and joint swelling.  Skin: Negative for color change and wound.  Neurological: Negative for syncope, weakness and numbness.   Physical Exam Updated Vital Signs BP (!) 140/98 (BP Location: Right Arm)   Pulse 66   Temp 98.3 F (36.8 C) (Oral)   Resp 20   Ht 5\' 9"  (1.753 m)   Wt 90.7 kg (200 lb)   SpO2 100%   BMI 29.53 kg/m   Physical Exam  Constitutional: He is oriented to person, place, and time. He appears well-developed and well-nourished. No distress.  Sitting comfortably on examination table  HENT:  Head: Normocephalic and atraumatic.  Eyes: Conjunctivae and EOM are normal.  Neck: Neck supple. No tracheal deviation present.  Cardiovascular: Normal rate.   Pulses:      Radial pulses are 2+ on the right side, and 2+ on the left side.  Pulmonary/Chest: Effort normal. No respiratory distress.  Musculoskeletal: Normal range of motion. He exhibits tenderness.  Right wrist: TTP to ulnar aspect of the wrist that extends up to the ulnar aspect of the hand. Extension and flexion of wrist intact but he reports pain. FROM of all five digits. Opposition of thumb intact. No snuffbox tenderness.  Neurological: He is alert and oriented to person, place, and time.  Sensation intact throughout all major nerve distributions of the hand  Skin: Skin is warm and dry.  Psychiatric: He has a normal mood and affect. His behavior is normal.  Nursing note and vitals reviewed.  ED Treatments / Results  Labs (all labs ordered are listed, but only abnormal results are displayed) Labs Reviewed - No data to display  EKG  EKG Interpretation None       Radiology Dg Wrist Complete Right  Result Date: 04/17/2017 CLINICAL DATA:  36 year old male with fall and right wrist pain. EXAM: RIGHT WRIST - COMPLETE 3+ VIEW COMPARISON:  None. FINDINGS: There is no evidence of fracture or dislocation. There is no evidence of arthropathy or other focal bone  abnormality. Soft tissues are unremarkable. IMPRESSION: Negative. Electronically Signed   By: Elgie CollardArash  Radparvar M.D.   On: 04/17/2017 19:20    Procedures Procedures (including critical care time)  DIAGNOSTIC STUDIES: Oxygen Saturation is 100% on RA, normal by my interpretation.    COORDINATION OF CARE: 7:45 PM Discussed treatment plan with pt at bedside and pt agreed to plan.  Medications Ordered in ED Medications  acetaminophen (TYLENOL) tablet 1,000 mg (not administered)     Initial Impression / Assessment and Plan / ED Course  I have reviewed the triage vital signs and the nursing notes.  Pertinent labs & imaging results that were available during my care of the patient were reviewed by me and considered in my medical decision making (see chart for details).     36 year old male who presents with right wrist pain 1 day after mechanical fall. Patient is afebrile, non-toxic appearing, sitting comfortably on examination table. Vital signs reviewed and stable. Patient is neurovascularly intact. Consider fracture versus dislocation versus sprain. X-rays ordered at triage. Analgesics provided in the department. Will apply ice to the affected area in the department.  Patient x-ray negative for obvious fracture or dislocation.  Explained results to patient and discussed that there could be a ligamentous or muscular injury that cannot be picked up on XR. Pt advised to follow up with orthopedics in 1-2 weeks if no improvement in symptoms. Patient given splint while in ED, conservative therapy recommended and discussed. Patient will be discharged home & is agreeable with above plan. Returns precautions discussed. Pt appears safe for discharge.  Final Clinical Impressions(s) / ED Diagnoses   Final diagnoses:  Wrist sprain, right, initial encounter    New Prescriptions New Prescriptions   No medications on file   I personally performed the services described in this documentation, which  was scribed in my presence. The recorded information has been reviewed and is accurate.     Maxwell CaulLayden, Elora Wolter A, PA-C 04/18/17 1603    Linwood DibblesKnapp, Jon, MD 04/19/17 [phone]

## 2017-04-17 NOTE — ED Triage Notes (Signed)
Pt reports yesterday he slipped and fell down the steps at his house. Presents with R wrist pain with decreased ROM. Denies numbness/tingling; cap refill <2secs; distal pulses intact.

## 2017-04-17 NOTE — ED Notes (Signed)
PMS intact before and after. Pt tolerated well. All questions answered. 

## 2017-04-17 NOTE — Discharge Instructions (Signed)
You can take Tylenol or Ibuprofen as directed for pain.  Follow the RICE (Rest, Ice, Compression, Elevation) protocol as directed.   As we discussed, follow-up with the referred orthopedic doctor in 1-2 weeks if there is no improvement in symptoms.   Return the emergency Department for any worsening pain, redness/swelling of the wrist that began to spread down the hand or arm, fever, numbness/weakness of the hand or any other worsening or concerning symptoms.

## 2017-04-27 ENCOUNTER — Ambulatory Visit (INDEPENDENT_AMBULATORY_CARE_PROVIDER_SITE_OTHER): Payer: BLUE CROSS/BLUE SHIELD | Admitting: Family Medicine

## 2017-04-27 ENCOUNTER — Other Ambulatory Visit: Payer: Self-pay | Admitting: Family Medicine

## 2017-04-27 ENCOUNTER — Ambulatory Visit (HOSPITAL_BASED_OUTPATIENT_CLINIC_OR_DEPARTMENT_OTHER)
Admission: RE | Admit: 2017-04-27 | Discharge: 2017-04-27 | Disposition: A | Discharge status: Home / Self Care | Payer: BLUE CROSS/BLUE SHIELD | Source: Ambulatory Visit | Attending: Family Medicine | Admitting: Family Medicine

## 2017-04-27 DIAGNOSIS — R52 Pain, unspecified: Secondary | ICD-10-CM

## 2017-04-27 DIAGNOSIS — S6991XA Unspecified injury of right wrist, hand and finger(s), initial encounter: Secondary | ICD-10-CM | POA: Diagnosis not present

## 2017-04-27 DIAGNOSIS — M25531 Pain in right wrist: Secondary | ICD-10-CM | POA: Insufficient documentation

## 2017-04-28 ENCOUNTER — Encounter: Payer: Self-pay | Admitting: Family Medicine

## 2017-04-29 DIAGNOSIS — S6991XA Unspecified injury of right wrist, hand and finger(s), initial encounter: Secondary | ICD-10-CM | POA: Insufficient documentation

## 2017-04-29 NOTE — Progress Notes (Signed)
PCP: Patient, No Pcp Per  Subjective:   HPI: Patient is a 36 y.o. male here for right wrist injury.  Patient reports on 7/28 he fell down about 6-7 steps. Put right hand out to brace self and sustained FOOSH injury to right wrist. Some swelling, immediate pain volar wrist. Pain has improved since then but pain is 4/10 but up to 7/10 and sharp at worst. Using wrist brace but not needing any medicine. 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 No skin changes, numbness. Left handed.  No past medical history on file.  Current Outpatient Prescriptions on File Prior to Visit  Medication Sig Dispense Refill  . ibuprofen (ADVIL,MOTRIN) 800 MG tablet Take 1 tablet (800 mg total) by mouth every 8 (eight) hours as needed. 30 tablet 0   No current facility-administered medications on file prior to visit.     No past surgical history on file.  No Known Allergies  Social History   Social History  . Marital status: Single    Spouse name: N/A  . Number of children: N/A  . Years of education: N/A   Occupational History  . Not on file.   Social History Main Topics  . Smoking status: Former Games developermoker  . Smokeless tobacco: Never Used  . Alcohol use Yes     Comment: occ  . Drug use: No  . Sexual activity: Not on file   Other Topics Concern  . Not on file   Social History Narrative  . No narrative on file    No family history on file.  BP (!) 142/98   Pulse 61   Ht 5\' 9"  (1.753 m)   Wt 205 lb (93 kg)   BMI 30.27 kg/m   Review of Systems: See HPI above.     Objective:  Physical Exam:  Gen: NAD, comfortable in exam room  Right wrist: No gross deformity, swelling, bruising. TTP volar wrist primarily over pisiform.  No other bony tenderness. FROM wrist and digits with 5/5 strength. Negative tinels. Sensation intact to light touch.  Left wrist: FROM without pain   Assessment & Plan:  1. Right wrist injury - independently reviewed radiographs including carpal tunnel view.  No  evidence bony abnormalities.  Consistent with wrist contusion and sprain.  Wrist brace as needed but to wear at all times at work.  Icing, elevation.  Ibuprofen or aleve if needed.

## 2017-04-29 NOTE — Assessment & Plan Note (Signed)
independently reviewed radiographs including carpal tunnel view.  No evidence bony abnormalities.  Consistent with wrist contusion and sprain.  Wrist brace as needed but to wear at all times at work.  Icing, elevation.  Ibuprofen or aleve if needed.

## 2017-05-11 ENCOUNTER — Ambulatory Visit: Payer: BLUE CROSS/BLUE SHIELD | Admitting: Family Medicine

## 2017-05-17 ENCOUNTER — Ambulatory Visit: Payer: BLUE CROSS/BLUE SHIELD | Admitting: Family Medicine

## 2017-10-04 ENCOUNTER — Ambulatory Visit (HOSPITAL_BASED_OUTPATIENT_CLINIC_OR_DEPARTMENT_OTHER)
Admission: RE | Admit: 2017-10-04 | Discharge: 2017-10-04 | Disposition: A | Discharge status: Home / Self Care | Payer: BLUE CROSS/BLUE SHIELD | Source: Ambulatory Visit | Attending: Family Medicine | Admitting: Family Medicine

## 2017-10-04 ENCOUNTER — Encounter: Payer: Self-pay | Admitting: Family Medicine

## 2017-10-04 ENCOUNTER — Ambulatory Visit (INDEPENDENT_AMBULATORY_CARE_PROVIDER_SITE_OTHER): Payer: BLUE CROSS/BLUE SHIELD | Admitting: Family Medicine

## 2017-10-04 VITALS — BP 135/85 | HR 65 | Ht 69.0 in | Wt 190.0 lb

## 2017-10-04 DIAGNOSIS — S6991XA Unspecified injury of right wrist, hand and finger(s), initial encounter: Secondary | ICD-10-CM

## 2017-10-04 DIAGNOSIS — M25531 Pain in right wrist: Secondary | ICD-10-CM

## 2017-10-04 NOTE — Patient Instructions (Addendum)
You have wrist and median nerve contusions. Wear wrist brace as often as possible until this resolves (I expect 2-3 weeks). Icing 15 minutes at a time 3-4 times a day to the side of the wrist. Ibuprofen 600mg  three times a day with food OR aleve 2 tabs twice a day with food regularly for 7-10 days then as needed. Follow up with me in 4 weeks if you're still having problems.

## 2017-10-05 ENCOUNTER — Encounter: Payer: Self-pay | Admitting: Family Medicine

## 2017-10-05 NOTE — Assessment & Plan Note (Signed)
independently reviewed radiographs including carpal tunnel view and no evidence fracture.  Consistent with wrist and median nerve contusions.  Reassured.  Wrist brace.  Icing if needed.  Ibuprofen or aleve for 7-10 days then as needed.

## 2017-10-05 NOTE — Progress Notes (Signed)
PCP: Patient, No Pcp Per  Subjective:   HPI: Patient is a 37 y.o. male here for right wrist injury.  Patient reports he improved from issue last year with right wrist. But on Sunday he struck a bookcase with open right hand during [employer] game. Felt ok after this but woke up on Monday with fairly severe pain on volar side of hand/wrist on ulnar side. Been using wrist brace again and taking ibuprofen. Feels better today than yesterday. Pain level 6/10 and sharp. Associated numbness into 1st-3rd digits. Left handed. No skin changes.  Mild swelling.  History reviewed. No pertinent past medical history.  Current Outpatient Medications on File Prior to Visit  Medication Sig Dispense Refill  . ibuprofen (ADVIL,MOTRIN) 800 MG tablet Take 1 tablet (800 mg total) by mouth every 8 (eight) hours as needed. 30 tablet 0   No current facility-administered medications on file prior to visit.     History reviewed. No pertinent surgical history.  No Known Allergies  Social History   Socioeconomic History  . Marital status: Single    Spouse name: Not on file  . Number of children: Not on file  . Years of education: Not on file  . Highest education level: Not on file  Social Needs  . Financial resource strain: Not on file  . Food insecurity - worry: Not on file  . Food insecurity - inability: Not on file  . Transportation needs - medical: Not on file  . Transportation needs - non-medical: Not on file  Occupational History  . Not on file  Tobacco Use  . Smoking status: Former Games developermoker  . Smokeless tobacco: Never Used  Substance and Sexual Activity  . Alcohol use: Yes    Comment: occ  . Drug use: No  . Sexual activity: Not on file  Other Topics Concern  . Not on file  Social History Narrative  . Not on file    History reviewed. No pertinent family history.  BP 135/85   Pulse 65   Ht 5\' 9"  (1.753 m)   Wt 190 lb (86.2 kg)   BMI 28.06 kg/m   Review of Systems: See HPI  above.     Objective:  Physical Exam:  Gen: NAD, comfortable in exam room  Right wrist: Mild swelling volar side into hand.  No bruising, other deformity.  No click of TFCC. TTP over carpal tunnel and ulnar side volar hand.  No focal bony tenderness. FROM with 5/5 strength.  FROM digits also with 5/5 strength. Sensation intact to light touch. 2+ radial pulse and < 2 sec cap refill.   Assessment & Plan:  1. Right wrist injury - independently reviewed radiographs including carpal tunnel view and no evidence fracture.  Consistent with wrist and median nerve contusions.  Reassured.  Wrist brace.  Icing if needed.  Ibuprofen or aleve for 7-10 days then as needed.

## 2019-11-15 DIAGNOSIS — H1045 Other chronic allergic conjunctivitis: Secondary | ICD-10-CM | POA: Diagnosis not present

## 2019-11-15 DIAGNOSIS — J301 Allergic rhinitis due to pollen: Secondary | ICD-10-CM | POA: Diagnosis not present

## 2019-11-15 DIAGNOSIS — J3081 Allergic rhinitis due to animal (cat) (dog) hair and dander: Secondary | ICD-10-CM | POA: Diagnosis not present

## 2019-11-15 DIAGNOSIS — J3089 Other allergic rhinitis: Secondary | ICD-10-CM | POA: Diagnosis not present

## 2019-12-13 ENCOUNTER — Ambulatory Visit: Payer: BC Managed Care – PPO | Attending: Internal Medicine

## 2019-12-13 DIAGNOSIS — Z23 Encounter for immunization: Secondary | ICD-10-CM

## 2019-12-13 NOTE — Progress Notes (Signed)
   Covid-19 Vaccination Clinic  Name:  Brandon Bowman    MRN: [id number] DOB: 1981-04-21  12/13/2019  Mr. Bonfield was observed post Covid-19 immunization for 15 minutes without incident. He was provided with Vaccine Information Sheet and instruction to access the V-Safe system.   Mr. Vanderveen was instructed to call 911 with any severe reactions post vaccine: Marland Kitchen Difficulty breathing  . Swelling of face and throat  . A fast heartbeat  . A bad rash all over body  . Dizziness and weakness   Immunizations Administered    Name Date Dose VIS Date Route   Pfizer COVID-19 Vaccine 12/13/2019  2:23 PM 0.3 mL 08/31/2019 Intramuscular   Manufacturer: [employer], [employer]   Lot: [id number]   NDC: [address]-4

## 2020-01-09 ENCOUNTER — Ambulatory Visit: Payer: BC Managed Care – PPO | Attending: Internal Medicine

## 2020-01-09 DIAGNOSIS — Z23 Encounter for immunization: Secondary | ICD-10-CM

## 2020-01-09 NOTE — Progress Notes (Signed)
   Covid-19 Vaccination Clinic  Name:  Brandon Bowman    MRN: [id number] DOB: 12-19-1980  01/09/2020  [name] was observed post Covid-19 immunization for 15 minutes without incident. He was provided with Vaccine Information Sheet and instruction to access the V-Safe system.   [name] was instructed to call 911 with any severe reactions post vaccine: Marland Kitchen Difficulty breathing  . Swelling of face and throat  . A fast heartbeat  . A bad rash all over body  . Dizziness and weakness   Immunizations Administered    Name Date Dose VIS Date Route   Pfizer COVID-19 Vaccine 01/09/2020  1:59 PM 0.3 mL 11/14/2018 Intramuscular   Manufacturer: [employer], [employer]   Lot: [id number]   NDC: [address]-9

## 2020-01-30 ENCOUNTER — Other Ambulatory Visit: Payer: Self-pay

## 2020-01-30 ENCOUNTER — Encounter (HOSPITAL_BASED_OUTPATIENT_CLINIC_OR_DEPARTMENT_OTHER): Payer: Self-pay

## 2020-01-30 ENCOUNTER — Emergency Department (HOSPITAL_BASED_OUTPATIENT_CLINIC_OR_DEPARTMENT_OTHER)
Admission: EM | Admit: 2020-01-30 | Discharge: 2020-01-30 | Disposition: A | Discharge status: Home / Self Care | Payer: BC Managed Care – PPO | Attending: Emergency Medicine | Admitting: Emergency Medicine

## 2020-01-30 DIAGNOSIS — R519 Headache, unspecified: Secondary | ICD-10-CM | POA: Diagnosis not present

## 2020-01-30 DIAGNOSIS — I10 Essential (primary) hypertension: Secondary | ICD-10-CM | POA: Diagnosis not present

## 2020-01-30 DIAGNOSIS — F17290 Nicotine dependence, other tobacco product, uncomplicated: Secondary | ICD-10-CM | POA: Diagnosis not present

## 2020-01-30 DIAGNOSIS — G44209 Tension-type headache, unspecified, not intractable: Secondary | ICD-10-CM | POA: Diagnosis not present

## 2020-01-30 LAB — CBC
HCT: 41.2 % (ref 39.0–52.0)
Hemoglobin: 14.4 g/dL (ref 13.0–17.0)
MCH: 28.2 pg (ref 26.0–34.0)
MCHC: 35 g/dL (ref 30.0–36.0)
MCV: 80.8 fL (ref 80.0–100.0)
Platelets: 240 10*3/uL (ref 150–400)
RBC: 5.1 MIL/uL (ref 4.22–5.81)
RDW: 12.9 % (ref 11.5–15.5)
WBC: 5.1 10*3/uL (ref 4.0–10.5)
nRBC: 0 % (ref 0.0–0.2)

## 2020-01-30 LAB — BASIC METABOLIC PANEL
Anion gap: 8 (ref 5–15)
BUN: 18 mg/dL (ref 6–20)
CO2: 23 mmol/L (ref 22–32)
Calcium: 8.7 mg/dL — ABNORMAL LOW (ref 8.9–10.3)
Chloride: 103 mmol/L (ref 98–111)
Creatinine, Ser: 1.16 mg/dL (ref 0.61–1.24)
GFR calc Af Amer: >60 mL/min (ref >60)
GFR calc non Af Amer: >60 mL/min (ref >60)
Glucose, Bld: 107 mg/dL — ABNORMAL HIGH (ref 70–99)
Potassium: 3.5 mmol/L (ref 3.5–5.1)
Sodium: 134 mmol/L — ABNORMAL LOW (ref 135–145)

## 2020-01-30 MED ORDER — AMLODIPINE BESYLATE 5 MG PO TABS: 5.0000 mg | ORAL_TABLET | Freq: Every day | ORAL | 0 refills | Status: DC

## 2020-01-30 MED ORDER — DEXAMETHASONE SODIUM PHOSPHATE 4 MG/ML IJ SOLN
4.0000 mg | Freq: Once | INTRAMUSCULAR | Status: AC
  Administered 2020-01-30: 4 mg via INTRAVENOUS
  Filled 2020-01-30: qty 1

## 2020-01-30 MED ORDER — METOCLOPRAMIDE HCL 5 MG/ML IJ SOLN
10.0000 mg | Freq: Once | INTRAMUSCULAR | Status: AC
  Administered 2020-01-30: 10 mg via INTRAVENOUS
  Filled 2020-01-30: qty 2

## 2020-01-30 MED ORDER — AMLODIPINE BESYLATE 5 MG PO TABS
5.0000 mg | ORAL_TABLET | Freq: Once | ORAL | Status: AC
  Administered 2020-01-30: 5 mg via ORAL
  Filled 2020-01-30: qty 1

## 2020-01-30 MED ORDER — DIPHENHYDRAMINE HCL 50 MG/ML IJ SOLN
12.5000 mg | Freq: Once | INTRAMUSCULAR | Status: AC
  Administered 2020-01-30: 12.5 mg via INTRAVENOUS
  Filled 2020-01-30: qty 1

## 2020-01-30 MED ORDER — KETOROLAC TROMETHAMINE 15 MG/ML IJ SOLN
15.0000 mg | Freq: Once | INTRAMUSCULAR | Status: AC
  Administered 2020-01-30: 15 mg via INTRAVENOUS
  Filled 2020-01-30: qty 1

## 2020-01-30 NOTE — ED Triage Notes (Addendum)
Pt c/o HA x 2 days-started after a flight-denies injury-denies fever flu sx-NAD-steady gait

## 2020-01-30 NOTE — ED Provider Notes (Signed)
[facility] EMERGENCY DEPARTMENT Provider Note   CSN: [id number] Arrival date & time: 01/30/20  1618     History Chief Complaint  Patient presents with  . Headache    Brandon Bowman is a 39 y.o. male with a history of hypertension presented to ED with a headache.  He reports gradual onset of a headache approximately 2 days ago.  He was at rest when it started.  He describes as a deep throbbing inside his head, not localized to any particular part of his skull.  He reports he tried taking Tylenol yesterday, as well as aspirin and Motrin, and did have relief.  Denies fevers, chills, vomiting.  Denies numbness or weakness of the extremities.  He is not on anti-HTN medications currently and has no PCP.  He does have a BP cuff at home, and his GF was concerned his pressure was high (about 170's systolic/90's diastolic) for the past 2 days, encouraging him to come to the ED.  No CP, abdominal pain, dysuria, hematuria. NO other significant medical problems.  He does currently have a headache 7/10 intensity.   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   No family hx of aneurysm or brain aneurysm   No hx of DVT or PE No baseline meds NKDA  HPI     History reviewed. No pertinent past medical history.  Patient Active Problem List   Diagnosis Date Noted  . Right wrist injury, initial encounter 04/29/2017  . ALLERGIC RHINITIS 12/25/2008  . ELEVATED BLOOD PRESSURE WITHOUT DIAGNOSIS OF HYPERTENSION 12/25/2008    History reviewed. No pertinent surgical history.     No family history on file.  Social History   Tobacco Use  . Smoking status: Current Some Day Smoker    Types: Cigars  . Smokeless tobacco: Never Used  Substance Use Topics  . Alcohol use: Yes    Comment: daily  . Drug use: No    Home Medications Prior to Admission medications   Medication Sig Start Date End Date Taking? Authorizing Provider  amLODipine (NORVASC) 5 MG  tablet Take 1 tablet (5 mg total) by mouth daily. 01/31/20 04/30/20  Terald Sleeper, MD  ibuprofen (ADVIL,MOTRIN) 800 MG tablet Take 1 tablet (800 mg total) by mouth every 8 (eight) hours as needed. 03/04/14   Elwin Mocha, MD    Allergies    Patient has no known allergies.  Review of Systems   Review of Systems  Constitutional: Negative for chills and fever.  HENT: Negative for ear pain and sore throat.   Eyes: Negative for pain and visual disturbance.  Respiratory: Negative for cough and shortness of breath.   Cardiovascular: Negative for chest pain and palpitations.  Gastrointestinal: Negative for abdominal pain, nausea and vomiting.  Genitourinary: Negative for dysuria and hematuria.  Musculoskeletal: Negative for arthralgias and back pain.  Skin: Negative for color change and rash.  Neurological: Positive for headaches. Negative for dizziness, seizures, syncope, weakness, light-headedness and numbness.  Psychiatric/Behavioral: Negative for agitation and confusion.  All other systems reviewed and are negative.   Physical Exam Updated Vital Signs BP (!) 141/84   Pulse (!) 55   Temp 97.9 F (36.6 C) (Oral)   Resp 16   Ht 5\' 9"  (1.753 m)   Wt 95.7 kg   SpO2 97%   BMI 31.16 kg/m   Physical Exam Vitals and nursing note reviewed.  Constitutional:      Appearance: He is well-developed.  HENT:     Head: Normocephalic and atraumatic.  Eyes:     Conjunctiva/sclera: Conjunctivae normal.  Cardiovascular:     Rate and Rhythm: Normal rate and regular rhythm.     Heart sounds: No murmur.  Pulmonary:     Effort: Pulmonary effort is normal. No respiratory distress.     Breath sounds: Normal breath sounds.  Abdominal:     Palpations: Abdomen is soft.     Tenderness: There is no abdominal tenderness.  Musculoskeletal:     Cervical back: Normal range of motion and neck supple. No rigidity.  Skin:    General: Skin is warm and dry.  Neurological:     Mental Status: He is  alert.     GCS: GCS eye subscore is 4. GCS verbal subscore is 5. GCS motor subscore is 6.     Cranial Nerves: No cranial nerve deficit or dysarthria.     Sensory: No sensory deficit.     Motor: No weakness.  Psychiatric:        Mood and Affect: Mood normal.        Behavior: Behavior normal.     ED Results / Procedures / Treatments   Labs (all labs ordered are listed, but only abnormal results are displayed) Labs Reviewed  BASIC METABOLIC PANEL - Abnormal; Notable for the following components:      Result Value   Sodium 134 (*)    Glucose, Bld 107 (*)    Calcium 8.7 (*)    All other components within normal limits  CBC    EKG None  Radiology No results found.  Procedures Procedures (including critical care time)  Medications Ordered in ED Medications  ketorolac (TORADOL) 15 MG/ML injection 15 mg (15 mg Intravenous Given 01/30/20 1729)  metoCLOPramide (REGLAN) injection 10 mg (10 mg Intravenous Given 01/30/20 1729)  dexamethasone (DECADRON) injection 4 mg (4 mg Intravenous Given 01/30/20 1732)  diphenhydrAMINE (BENADRYL) injection 12.5 mg (12.5 mg Intravenous Given 01/30/20 1732)  amLODipine (NORVASC) tablet 5 mg (5 mg Oral Given 01/30/20 1726)    ED Course  I have reviewed the triage vital signs and the nursing notes.  Pertinent labs & imaging results that were available during my care of the patient were reviewed by me and considered in my medical decision making (see chart for details).  39 yo male here with headache for 2 days, concern for elevated BP at home.  Currently asymptomatic.  His BP is elevated on arrival, consistent with prior ED visits.  I suspect he may be chronically elevated, and we discussed at length the importance of BP control.  I can initiate amlodipine here but he needs to establish care with a PCP and keep a daily log at home.  We also discussed reducing the sodium in his diet, as he eats lots of fast food and processed foods.  Labs ordered to  evaluate kidney function (Cr 1.16), electrolytes wnl, and no sign of anemia.  No leukocytosis, fever, or neck stiffness to suggest meningitis.  Highly doubtful of SAH or brain bleed given he is asymptomatic now.  Doubtful of sinus infection on exam.   This may be a viral illness.  I ordered an IV migraine cocktail including toradol, benadryl, decadron.  ON reassessment his headache had dramatically improved and was gone.  Clinical Course as of Jan 30 1013  Wed Jan 30, 2020  1823 Patient reports is feeling significantly better.  His girlfriend is now present at the bedside.  We discussed  dietary modifications to help with his blood pressure, less fast food and processed food high in sodium.  I will start him on amlodipine.  He can follow-up in the wellness clinic.   [MT]    Clinical Course User Index [MT] Terald Sleeper, MD    Final Clinical Impression(s) / ED Diagnoses Final diagnoses:  Hypertension, unspecified type  Nonintractable headache, unspecified chronicity pattern, unspecified headache type    Rx / DC Orders ED Discharge Orders         Ordered    amLODipine (NORVASC) 5 MG tablet  Daily     01/30/20 1825           Terald Sleeper, MD 01/31/20 1014

## 2020-01-30 NOTE — Discharge Instructions (Signed)
Keep a daily log of your blood pressures at home.

## 2020-01-31 NOTE — Progress Notes (Addendum)
01/31/2020 506 pm TOC CM spoke to pt and agreeable to CM arranging PCP appt for follow up and establish care for HTN. Pt states he has family history of HTN. 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 Explained he can go to the [employer] for provider list or call number on card for provider list. Appt scheduled for 02/14/2020 at 2 pm. Explained to pt to call and reschedule if he cannot make appt. ED provider updated.  Pt states he did get medication filled at pharmacy. Explained to take medications as prescribed. Isidoro Donning RN CCM, [facility] ED TOC CM [phone]

## 2020-02-13 ENCOUNTER — Other Ambulatory Visit: Payer: Self-pay

## 2020-02-14 ENCOUNTER — Ambulatory Visit: Payer: BC Managed Care – PPO | Admitting: Family Medicine

## 2020-02-28 ENCOUNTER — Other Ambulatory Visit: Payer: Self-pay

## 2020-02-28 ENCOUNTER — Encounter: Payer: Self-pay | Admitting: Family Medicine

## 2020-02-28 ENCOUNTER — Ambulatory Visit (INDEPENDENT_AMBULATORY_CARE_PROVIDER_SITE_OTHER): Payer: BC Managed Care – PPO | Admitting: Family Medicine

## 2020-02-28 VITALS — BP 142/88 | HR 74 | Temp 97.4°F | Ht 68.0 in | Wt 211.4 lb

## 2020-02-28 DIAGNOSIS — I10 Essential (primary) hypertension: Secondary | ICD-10-CM | POA: Diagnosis not present

## 2020-02-28 MED ORDER — AMLODIPINE BESYLATE 10 MG PO TABS: 10.0000 mg | ORAL_TABLET | Freq: Every day | ORAL | 0 refills | Status: DC

## 2020-02-28 NOTE — Progress Notes (Signed)
New Patient Office Visit  Subjective:  Patient ID: Brandon Bowman, male    DOB: April 21, 1981  Age: 39 y.o. MRN: [id number]  CC:  Chief Complaint  Patient presents with  . New Patient (Initial Visit)    Patient is here today as a new patient to establish care.  . Hypertension    He is also here today to discuss his high BP. States that he went to ER 3-weeks-ago and they started him on Amlodipine which he has been taking since.  He says "I can tell it's better but not where it is supposed to be."    HPI Brandon Bowman presents for follow-up of hypertension.  History of elevated blood pressure that has not been treated in the past until recently when he had presented to the emergency room with a headache.  Patient was started on amlodipine 5 mg and advised to follow-up with PCP.  He is a truck driver requires a CDL.  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  He has been taking the 5 mg of amlodipine over the last 3 to 4 weeks.  Pressure has been running in the 140s over upper 80s.  He is having no issues taking the medication.  Denies swelling in his lower extremities.  Positive family history of hypertension on both sides.  He enjoys 2-3 alcoholic servings daily.  He smokes cigars over the weekends.  He does consume a lot of fast food.  He does does not does not use street drugs.  BMP and CBC drawn in the emergency room were essentially normal.  No past medical history on file.  No past surgical history on file.  No family history on file.  Social History   Socioeconomic History  . Marital status: Single    Spouse name: Not on file  . Number of children: Not on file  . Years of education: Not on file  . Highest education level: Not on file  Occupational History  . Not on file  Tobacco Use  . Smoking status: Current Some Day Smoker    Types: Cigars  . Smokeless tobacco: Never Used  Vaping Use  . Vaping Use: Never used    Substance and Sexual Activity  . Alcohol use: Yes    Comment: daily  . Drug use: No  . Sexual activity: Not on file  Other Topics Concern  . Not on file  Social History Narrative  . Not on file   Social Determinants of Health   Financial Resource Strain:   . Difficulty of Paying Living Expenses:   Food Insecurity:   . Worried About Charity fundraiser in the Last Year:   . Arboriculturist in the Last Year:   Transportation Needs:   . Film/video editor (Medical):   Marland Kitchen Lack of Transportation (Non-Medical):   Physical Activity:   . Days of Exercise per Week:   . Minutes of Exercise per Session:   Stress:   . Feeling of Stress :   Social Connections:   . Frequency of Communication with Friends and Family:   . Frequency of Social Gatherings with Friends and Family:   . Attends Religious Services:   . Active Member of Clubs or Organizations:   . Attends Archivist Meetings:   Marland Kitchen Marital Status:   Intimate Partner Violence:   . Fear of Current or Ex-Partner:   . Emotionally Abused:   .  Physically Abused:   . Sexually Abused:     ROS Review of Systems  Constitutional: Negative.   HENT: Negative.   Eyes: Negative for photophobia and visual disturbance.  Respiratory: Negative.   Cardiovascular: Negative.   Gastrointestinal: Negative.   Genitourinary: Negative.   Neurological: Positive for headaches. Negative for tremors, speech difficulty and light-headedness.  Hematological: Does not bruise/bleed easily.  Psychiatric/Behavioral: Negative.    Depression screen [facility] 2/9 02/28/2020  Decreased Interest 0  Down, Depressed, Hopeless 0  PHQ - 2 Score 0  Altered sleeping 0  Tired, decreased energy 0  Change in appetite 0  Feeling bad or failure about yourself  0  Trouble concentrating 0  Moving slowly or fidgety/restless 0  Suicidal thoughts 0  PHQ-9 Score 0  Difficult doing work/chores Not difficult at all    Objective:   Today's Vitals: BP (!) 142/88  (BP Location: Left Arm, Patient Position: Sitting, Cuff Size: Normal)   Pulse 74   Temp (!) 97.4 F (36.3 C) (Temporal)   Ht 5\' 8"  (1.727 m)   Wt 211 lb 6.4 oz (95.9 kg)   SpO2 97%   BMI 32.14 kg/m   Physical Exam Vitals and nursing note reviewed.  Constitutional:      General: He is not in acute distress.    Appearance: Normal appearance. He is not ill-appearing, toxic-appearing or diaphoretic.  HENT:     Head: Normocephalic and atraumatic.     Right Ear: Tympanic membrane, ear canal and external ear normal.     Left Ear: Tympanic membrane, ear canal and external ear normal.     Nose: Nose normal.     Mouth/Throat:     Mouth: Mucous membranes are dry.     Pharynx: Oropharynx is clear.  Eyes:     General: No scleral icterus.       Right eye: No discharge.        Left eye: No discharge.     Extraocular Movements: Extraocular movements intact.     Conjunctiva/sclera: Conjunctivae normal.     Pupils: Pupils are equal, round, and reactive to light.  Cardiovascular:     Rate and Rhythm: Normal rate and regular rhythm.  Pulmonary:     Effort: Pulmonary effort is normal.     Breath sounds: Normal breath sounds.  Abdominal:     General: Bowel sounds are normal.  Musculoskeletal:     Cervical back: No rigidity or tenderness.     Right lower leg: No edema.     Left lower leg: No edema.  Lymphadenopathy:     Cervical: No cervical adenopathy.  Skin:    General: Skin is warm and dry.  Neurological:     Mental Status: He is alert and oriented to person, place, and time.  Psychiatric:        Mood and Affect: Mood normal.        Behavior: Behavior normal.     Assessment & Plan:   Problem List Items Addressed This Visit    None    Visit Diagnoses    Essential hypertension    -  Primary   Relevant Medications   amLODipine (NORVASC) 10 MG tablet      Outpatient Encounter Medications as of 02/28/2020  Medication Sig  . ibuprofen (ADVIL,MOTRIN) 800 MG tablet Take 1 tablet  (800 mg total) by mouth every 8 (eight) hours as needed.  . [DISCONTINUED] amLODipine (NORVASC) 5 MG tablet Take 1 tablet (5 mg total) by mouth daily.  04/29/2020  amLODipine (NORVASC) 10 MG tablet Take 1 tablet (10 mg total) by mouth daily.   No facility-administered encounter medications on file as of 02/28/2020.    Follow-up: Return in about 3 months (around 05/30/2020).   Increased amlodipine to 10 mg daily.  Given information on hypertension, managing hypertension and preventing it.  We discussed specifically sodium, alcohol and tobacco and their effect on blood pressure.  Asked him to moderate alcohol to no more than 2 servings daily. Mliss Sax, MD

## 2020-02-28 NOTE — Patient Instructions (Signed)
Managing Your Hypertension Hypertension is commonly called high blood pressure. This is when the force of your blood pressing against the walls of your arteries is too strong. Arteries are blood vessels that carry blood from your heart throughout your body. Hypertension forces the heart to work harder to pump blood, and may cause the arteries to become narrow or stiff. Having untreated or uncontrolled hypertension can cause heart attack, stroke, kidney disease, and other problems. What are blood pressure readings? A blood pressure reading consists of a higher number over a lower number. Ideally, your blood pressure should be below 120/80. The first ("top") number is called the systolic pressure. It is a measure of the pressure in your arteries as your heart beats. The second ("bottom") number is called the diastolic pressure. It is a measure of the pressure in your arteries as the heart relaxes. What does my blood pressure reading mean? Blood pressure is classified into four stages. Based on your blood pressure reading, your health care provider may use the following stages to determine what type of treatment you need, if any. Systolic pressure and diastolic pressure are measured in a unit called mm Hg. Normal  Systolic pressure: below 120.  Diastolic pressure: below 80. Elevated  Systolic pressure: 120-129.  Diastolic pressure: below 80. Hypertension stage 1  Systolic pressure: 130-139.  Diastolic pressure: 80-89. Hypertension stage 2  Systolic pressure: 140 or above.  Diastolic pressure: 90 or above. What health risks are associated with hypertension? Managing your hypertension is an important responsibility. Uncontrolled hypertension can lead to:  A heart attack.  A stroke.  A weakened blood vessel (aneurysm).  Heart failure.  Kidney damage.  Eye damage.  Metabolic syndrome.  Memory and concentration problems. What changes can I make to manage my  hypertension? Hypertension can be managed by making lifestyle changes and possibly by taking medicines. Your health care provider will help you make a plan to bring your blood pressure within a normal range. Eating and drinking   Eat a diet that is high in fiber and potassium, and low in salt (sodium), added sugar, and fat. An example eating plan is called the DASH (Dietary Approaches to Stop Hypertension) diet. To eat this way: ? Eat plenty of fresh fruits and vegetables. Try to fill half of your plate at each meal with fruits and vegetables. ? Eat whole grains, such as whole wheat pasta, brown rice, or whole grain bread. Fill about one quarter of your plate with whole grains. ? Eat low-fat diary products. ? Avoid fatty cuts of meat, processed or cured meats, and poultry with skin. Fill about one quarter of your plate with lean proteins such as fish, chicken without skin, beans, eggs, and tofu. ? Avoid premade and processed foods. These tend to be higher in sodium, added sugar, and fat.  Reduce your daily sodium intake. Most people with hypertension should eat less than 1,500 mg of sodium a day.  Limit alcohol intake to no more than 1 drink a day for nonpregnant women and 2 drinks a day for men. One drink equals 12 oz of beer, 5 oz of wine, or 1 oz of hard liquor. Lifestyle  Work with your health care provider to maintain a healthy body weight, or to lose weight. Ask what an ideal weight is for you.  Get at least 30 minutes of exercise that causes your heart to beat faster (aerobic exercise) most days of the week. Activities may include walking, swimming, or biking.  Include exercise   to strengthen your muscles (resistance exercise), such as weight lifting, as part of your weekly exercise routine. Try to do these types of exercises for 30 minutes at least 3 days a week.  Do not use any products that contain nicotine or tobacco, such as cigarettes and e-cigarettes. If you need help quitting,  ask your health care provider.  Control any long-term (chronic) conditions you have, such as high cholesterol or diabetes. Monitoring  Monitor your blood pressure at home as told by your health care provider. Your personal target blood pressure may vary depending on your medical conditions, your age, and other factors.  Have your blood pressure checked regularly, as often as told by your health care provider. Working with your health care provider  Review all the medicines you take with your health care provider because there may be side effects or interactions.  Talk with your health care provider about your diet, exercise habits, and other lifestyle factors that may be contributing to hypertension.  Visit your health care provider regularly. Your health care provider can help you create and adjust your plan for managing hypertension. Will I need medicine to control my blood pressure? Your health care provider may prescribe medicine if lifestyle changes are not enough to get your blood pressure under control, and if:  Your systolic blood pressure is 130 or higher.  Your diastolic blood pressure is 80 or higher. Take medicines only as told by your health care provider. Follow the directions carefully. Blood pressure medicines must be taken as prescribed. The medicine does not work as well when you skip doses. Skipping doses also puts you at risk for problems. Contact a health care provider if:  You think you are having a reaction to medicines you have taken.  You have repeated (recurrent) headaches.  You feel dizzy.  You have swelling in your ankles.  You have trouble with your vision. Get help right away if:  You develop a severe headache or confusion.  You have unusual weakness or numbness, or you feel faint.  You have severe pain in your chest or abdomen.  You vomit repeatedly.  You have trouble breathing. Summary  Hypertension is when the force of blood pumping  through your arteries is too strong. If this condition is not controlled, it may put you at risk for serious complications.  Your personal target blood pressure may vary depending on your medical conditions, your age, and other factors. For most people, a normal blood pressure is less than 120/80.  Hypertension is managed by lifestyle changes, medicines, or both. Lifestyle changes include weight loss, eating a healthy, low-sodium diet, exercising more, and limiting alcohol. This information is not intended to replace advice given to you by your health care provider. Make sure you discuss any questions you have with your health care provider. Document Revised: 12/29/2018 Document Reviewed: 08/04/2016 Elsevier Patient Education  [place].  Hypertension, Adult High blood pressure (hypertension) is when the force of blood pumping through the arteries is too strong. The arteries are the blood vessels that carry blood from the heart throughout the body. Hypertension forces the heart to work harder to pump blood and may cause arteries to become narrow or stiff. Untreated or uncontrolled hypertension can cause a heart attack, heart failure, a stroke, kidney disease, and other problems. A blood pressure reading consists of a higher number over a lower number. Ideally, your blood pressure should be below 120/80. The first ("top") number is called the systolic pressure. It  is a measure of the pressure in your arteries as your heart beats. The second ("bottom") number is called the diastolic pressure. It is a measure of the pressure in your arteries as the heart relaxes. What are the causes? The exact cause of this condition is not known. There are some conditions that result in or are related to high blood pressure. What increases the risk? Some risk factors for high blood pressure are under your control. The following factors may make you more likely to develop this condition:  Smoking.  Having  type 2 diabetes mellitus, high cholesterol, or both.  Not getting enough exercise or physical activity.  Being overweight.  Having too much fat, sugar, calories, or salt (sodium) in your diet.  Drinking too much alcohol. Some risk factors for high blood pressure may be difficult or impossible to change. Some of these factors include:  Having chronic kidney disease.  Having a family history of high blood pressure.  Age. Risk increases with age.  Race. You may be at higher risk if you are African American.  Gender. Men are at higher risk than women before age 48. After age 20, women are at higher risk than men.  Having obstructive sleep apnea.  Stress. What are the signs or symptoms? High blood pressure may not cause symptoms. Very high blood pressure (hypertensive crisis) may cause:  Headache.  Anxiety.  Shortness of breath.  Nosebleed.  Nausea and vomiting.  Vision changes.  Severe chest pain.  Seizures. How is this diagnosed? This condition is diagnosed by measuring your blood pressure while you are seated, with your arm resting on a flat surface, your legs uncrossed, and your feet flat on the floor. The cuff of the blood pressure monitor will be placed directly against the skin of your upper arm at the level of your heart. It should be measured at least twice using the same arm. Certain conditions can cause a difference in blood pressure between your right and left arms. Certain factors can cause blood pressure readings to be lower or higher than normal for a short period of time:  When your blood pressure is higher when you are in a health care provider's office than when you are at home, this is called white coat hypertension. Most people with this condition do not need medicines.  When your blood pressure is higher at home than when you are in a health care provider's office, this is called masked hypertension. Most people with this condition may need medicines to  control blood pressure. If you have a high blood pressure reading during one visit or you have normal blood pressure with other risk factors, you may be asked to:  Return on a different day to have your blood pressure checked again.  Monitor your blood pressure at home for 1 week or longer. If you are diagnosed with hypertension, you may have other blood or imaging tests to help your health care provider understand your overall risk for other conditions. How is this treated? This condition is treated by making healthy lifestyle changes, such as eating healthy foods, exercising more, and reducing your alcohol intake. Your health care provider may prescribe medicine if lifestyle changes are not enough to get your blood pressure under control, and if:  Your systolic blood pressure is above 130.  Your diastolic blood pressure is above 80. Your personal target blood pressure may vary depending on your medical conditions, your age, and other factors. Follow these instructions at home: Eating  and drinking   Eat a diet that is high in fiber and potassium, and low in sodium, added sugar, and fat. An example eating plan is called the DASH (Dietary Approaches to Stop Hypertension) diet. To eat this way: ? Eat plenty of fresh fruits and vegetables. Try to fill one half of your plate at each meal with fruits and vegetables. ? Eat whole grains, such as whole-wheat pasta, brown rice, or whole-grain bread. Fill about one fourth of your plate with whole grains. ? Eat or drink low-fat dairy products, such as skim milk or low-fat yogurt. ? Avoid fatty cuts of meat, processed or cured meats, and poultry with skin. Fill about one fourth of your plate with lean proteins, such as fish, chicken without skin, beans, eggs, or tofu. ? Avoid pre-made and processed foods. These tend to be higher in sodium, added sugar, and fat.  Reduce your daily sodium intake. Most people with hypertension should eat less than 1,500 mg  of sodium a day.  Do not drink alcohol if: ? Your health care provider tells you not to drink. ? You are pregnant, may be pregnant, or are planning to become pregnant.  If you drink alcohol: ? Limit how much you use to:  0-1 drink a day for women.  0-2 drinks a day for men. ? Be aware of how much alcohol is in your drink. In the [place] one drink equals one 12 oz bottle of beer (355 mL), one 5 oz glass of wine (148 mL), or one 1 oz glass of hard liquor (44 mL). Lifestyle   Work with your health care provider to maintain a healthy body weight or to lose weight. Ask what an ideal weight is for you.  Get at least 30 minutes of exercise most days of the week. Activities may include walking, swimming, or biking.  Include exercise to strengthen your muscles (resistance exercise), such as Pilates or lifting weights, as part of your weekly exercise routine. Try to do these types of exercises for 30 minutes at least 3 days a week.  Do not use any products that contain nicotine or tobacco, such as cigarettes, e-cigarettes, and chewing tobacco. If you need help quitting, ask your health care provider.  Monitor your blood pressure at home as told by your health care provider.  Keep all follow-up visits as told by your health care provider. This is important. Medicines  Take over-the-counter and prescription medicines only as told by your health care provider. Follow directions carefully. Blood pressure medicines must be taken as prescribed.  Do not skip doses of blood pressure medicine. Doing this puts you at risk for problems and can make the medicine less effective.  Ask your health care provider about side effects or reactions to medicines that you should watch for. Contact a health care provider if you:  Think you are having a reaction to a medicine you are taking.  Have headaches that keep coming back (recurring).  Feel dizzy.  Have swelling in your ankles.  Have trouble with your  vision. Get help right away if you:  Develop a severe headache or confusion.  Have unusual weakness or numbness.  Feel faint.  Have severe pain in your chest or abdomen.  Vomit repeatedly.  Have trouble breathing. Summary  Hypertension is when the force of blood pumping through your arteries is too strong. If this condition is not controlled, it may put you at risk for serious complications.  Your personal target blood pressure may  vary depending on your medical conditions, your age, and other factors. For most people, a normal blood pressure is less than 120/80.  Hypertension is treated with lifestyle changes, medicines, or a combination of both. Lifestyle changes include losing weight, eating a healthy, low-sodium diet, exercising more, and limiting alcohol. This information is not intended to replace advice given to you by your health care provider. Make sure you discuss any questions you have with your health care provider. Document Revised: 05/17/2018 Document Reviewed: 05/17/2018 Elsevier Patient Education  2020 [employer]  Preventing Hypertension Hypertension, commonly called high blood pressure, is when the force of blood pumping through the arteries is too strong. Arteries are blood vessels that carry blood from the heart throughout the body. Over time, hypertension can damage the arteries and decrease blood flow to important parts of the body, including the brain, heart, and kidneys. Often, hypertension does not cause symptoms until blood pressure is very high. For this reason, it is important to have your blood pressure checked on a regular basis. Hypertension can often be prevented with diet and lifestyle changes. If you already have hypertension, you can control it with diet and lifestyle changes, as well as medicine. What nutrition changes can be made? Maintain a healthy diet. This includes:  Eating less salt (sodium). Ask your health care provider how much sodium is  safe for you to have. The general recommendation is to consume less than 1 tsp (2,300 mg) of sodium a day. ? Do not add salt to your food. ? Choose low-sodium options when grocery shopping and eating out.  Limiting fats in your diet. You can do this by eating low-fat or fat-free dairy products and by eating less red meat.  Eating more fruits, vegetables, and whole grains. Make a goal to eat: ? 1-2 cups of fresh fruits and vegetables each day. ? 3-4 servings of whole grains each day.  Avoiding foods and beverages that have added sugars.  Eating fish that contain healthy fats (omega-3 fatty acids), such as mackerel or salmon. If you need help putting together a healthy eating plan, try the DASH diet. This diet is high in fruits, vegetables, and whole grains. It is low in sodium, red meat, and added sugars. DASH stands for Dietary Approaches to Stop Hypertension. What lifestyle changes can be made?   Lose weight if you are overweight. Losing just 3?5% of your body weight can help prevent or control hypertension. ? For example, if your present weight is 200 lb (91 kg), a loss of 3-5% of your weight means losing 6-10 lb (2.7-4.5 kg). ? Ask your health care provider to help you with a diet and exercise plan to safely lose weight.  Get enough exercise. Do at least 150 minutes of moderate-intensity exercise each week. ? You could do this in short exercise sessions several times a day, or you could do longer exercise sessions a few times a week. For example, you could take a brisk 10-minute walk or bike ride, 3 times a day, for 5 days a week.  Find ways to reduce stress, such as exercising, meditating, listening to music, or taking a yoga class. If you need help reducing stress, ask your health care provider.  Do not smoke. This includes e-cigarettes. Chemicals in tobacco and nicotine products raise your blood pressure each time you smoke. If you need help quitting, ask your health care  provider.  Avoid alcohol. If you drink alcohol, limit alcohol intake to no more than 1 drink  a day for nonpregnant women and 2 drinks a day for men. One drink equals 12 oz of beer, 5 oz of wine, or 1 oz of hard liquor. Why are these changes important? Diet and lifestyle changes can help you prevent hypertension, and they may make you feel better overall and improve your quality of life. If you have hypertension, making these changes will help you control it and help prevent major complications, such as:  Hardening and narrowing of arteries that supply blood to: ? Your heart. This can cause a heart attack. ? Your brain. This can cause a stroke. ? Your kidneys. This can cause kidney failure.  Stress on your heart muscle, which can cause heart failure. What can I do to lower my risk?  Work with your health care provider to make a hypertension prevention plan that works for you. Follow your plan and keep all follow-up visits as told by your health care provider.  Learn how to check your blood pressure at home. Make sure that you know your personal target blood pressure, as told by your health care provider. How is this treated? In addition to diet and lifestyle changes, your health care provider may recommend medicines to help lower your blood pressure. You may need to try a few different medicines to find what works best for you. You also may need to take more than one medicine. Take over-the-counter and prescription medicines only as told by your health care provider. Where to find support Your health care provider can help you prevent hypertension and help you keep your blood pressure at a healthy level. Your local hospital or your community may also provide support services and prevention programs. The American Heart Association offers an online support network at: https://www.lee.net/ Where to find more information Learn more about hypertension  from:  National Heart, Lung, and Blood Institute: https://www.peterson.org/  Centers for Disease Control and Prevention: AboutHD.co.nz  American Academy of Family Physicians: http://familydoctor.org/familydoctor/en/diseases-conditions/high-blood-pressure.printerview.all.html Learn more about the DASH diet from:  National Heart, Lung, and Blood Institute: WedMap.it Contact a health care provider if:  You think you are having a reaction to medicines you have taken.  You have recurrent headaches or feel dizzy.  You have swelling in your ankles.  You have trouble with your vision. Summary  Hypertension often does not cause any symptoms until blood pressure is very high. It is important to get your blood pressure checked regularly.  Diet and lifestyle changes are the most important steps in preventing hypertension.  By keeping your blood pressure in a healthy range, you can prevent complications like heart attack, heart failure, stroke, and kidney failure.  Work with your health care provider to make a hypertension prevention plan that works for you. This information is not intended to replace advice given to you by your health care provider. Make sure you discuss any questions you have with your health care provider. Document Revised: 12/29/2018 Document Reviewed: 05/17/2016 Elsevier Patient Education  2020 [employer].

## 2020-03-07 ENCOUNTER — Other Ambulatory Visit: Payer: Self-pay | Admitting: Family Medicine

## 2020-03-07 ENCOUNTER — Telehealth: Payer: Self-pay | Admitting: Family Medicine

## 2020-03-07 DIAGNOSIS — I10 Essential (primary) hypertension: Secondary | ICD-10-CM

## 2020-03-07 NOTE — Telephone Encounter (Signed)
Patient states that he is on his way out of town and left his Amlodipine at home. He would like a prescription sent to  [facility] in [place] (ph. [phone]). If approved, please call him at [phone] to let him know that it has been sent in.  Thank you, [name]

## 2020-05-30 ENCOUNTER — Encounter: Payer: Self-pay | Admitting: Family Medicine

## 2020-05-30 ENCOUNTER — Other Ambulatory Visit: Payer: Self-pay

## 2020-05-30 ENCOUNTER — Ambulatory Visit (INDEPENDENT_AMBULATORY_CARE_PROVIDER_SITE_OTHER): Payer: BC Managed Care – PPO | Admitting: Family Medicine

## 2020-05-30 VITALS — BP 152/90 | HR 92 | Temp 97.8°F | Ht 68.0 in | Wt 217.8 lb

## 2020-05-30 DIAGNOSIS — I10 Essential (primary) hypertension: Secondary | ICD-10-CM

## 2020-05-30 MED ORDER — CHLORTHALIDONE 25 MG PO TABS: 25.0000 mg | ORAL_TABLET | Freq: Every day | ORAL | 1 refills | Status: DC

## 2020-05-30 MED ORDER — AMLODIPINE BESYLATE 10 MG PO TABS: 10.0000 mg | ORAL_TABLET | Freq: Every day | ORAL | 0 refills | Status: DC

## 2020-05-30 NOTE — Patient Instructions (Signed)
Managing Your Hypertension Hypertension is commonly called high blood pressure. This is when the force of your blood pressing against the walls of your arteries is too strong. Arteries are blood vessels that carry blood from your heart throughout your body. Hypertension forces the heart to work harder to pump blood, and may cause the arteries to become narrow or stiff. Having untreated or uncontrolled hypertension can cause heart attack, stroke, kidney disease, and other problems. What are blood pressure readings? A blood pressure reading consists of a higher number over a lower number. Ideally, your blood pressure should be below 120/80. The first ("top") number is called the systolic pressure. It is a measure of the pressure in your arteries as your heart beats. The second ("bottom") number is called the diastolic pressure. It is a measure of the pressure in your arteries as the heart relaxes. What does my blood pressure reading mean? Blood pressure is classified into four stages. Based on your blood pressure reading, your health care provider may use the following stages to determine what type of treatment you need, if any. Systolic pressure and diastolic pressure are measured in a unit called mm Hg. Normal  Systolic pressure: below 120.  Diastolic pressure: below 80. Elevated  Systolic pressure: 120-129.  Diastolic pressure: below 80. Hypertension stage 1  Systolic pressure: 130-139.  Diastolic pressure: 80-89. Hypertension stage 2  Systolic pressure: 140 or above.  Diastolic pressure: 90 or above. What health risks are associated with hypertension? Managing your hypertension is an important responsibility. Uncontrolled hypertension can lead to:  A heart attack.  A stroke.  A weakened blood vessel (aneurysm).  Heart failure.  Kidney damage.  Eye damage.  Metabolic syndrome.  Memory and concentration problems. What changes can I make to manage my  hypertension? Hypertension can be managed by making lifestyle changes and possibly by taking medicines. Your health care provider will help you make a plan to bring your blood pressure within a normal range. Eating and drinking   Eat a diet that is high in fiber and potassium, and low in salt (sodium), added sugar, and fat. An example eating plan is called the DASH (Dietary Approaches to Stop Hypertension) diet. To eat this way: ? Eat plenty of fresh fruits and vegetables. Try to fill half of your plate at each meal with fruits and vegetables. ? Eat whole grains, such as whole wheat pasta, brown rice, or whole grain bread. Fill about one quarter of your plate with whole grains. ? Eat low-fat diary products. ? Avoid fatty cuts of meat, processed or cured meats, and poultry with skin. Fill about one quarter of your plate with lean proteins such as fish, chicken without skin, beans, eggs, and tofu. ? Avoid premade and processed foods. These tend to be higher in sodium, added sugar, and fat.  Reduce your daily sodium intake. Most people with hypertension should eat less than 1,500 mg of sodium a day.  Limit alcohol intake to no more than 1 drink a day for nonpregnant women and 2 drinks a day for men. One drink equals 12 oz of beer, 5 oz of wine, or 1 oz of hard liquor. Lifestyle  Work with your health care provider to maintain a healthy body weight, or to lose weight. Ask what an ideal weight is for you.  Get at least 30 minutes of exercise that causes your heart to beat faster (aerobic exercise) most days of the week. Activities may include walking, swimming, or biking.  Include exercise   to strengthen your muscles (resistance exercise), such as weight lifting, as part of your weekly exercise routine. Try to do these types of exercises for 30 minutes at least 3 days a week.  Do not use any products that contain nicotine or tobacco, such as cigarettes and e-cigarettes. If you need help quitting,  ask your health care provider.  Control any long-term (chronic) conditions you have, such as high cholesterol or diabetes. Monitoring  Monitor your blood pressure at home as told by your health care provider. Your personal target blood pressure may vary depending on your medical conditions, your age, and other factors.  Have your blood pressure checked regularly, as often as told by your health care provider. Working with your health care provider  Review all the medicines you take with your health care provider because there may be side effects or interactions.  Talk with your health care provider about your diet, exercise habits, and other lifestyle factors that may be contributing to hypertension.  Visit your health care provider regularly. Your health care provider can help you create and adjust your plan for managing hypertension. Will I need medicine to control my blood pressure? Your health care provider may prescribe medicine if lifestyle changes are not enough to get your blood pressure under control, and if:  Your systolic blood pressure is 130 or higher.  Your diastolic blood pressure is 80 or higher. Take medicines only as told by your health care provider. Follow the directions carefully. Blood pressure medicines must be taken as prescribed. The medicine does not work as well when you skip doses. Skipping doses also puts you at risk for problems. Contact a health care provider if:  You think you are having a reaction to medicines you have taken.  You have repeated (recurrent) headaches.  You feel dizzy.  You have swelling in your ankles.  You have trouble with your vision. Get help right away if:  You develop a severe headache or confusion.  You have unusual weakness or numbness, or you feel faint.  You have severe pain in your chest or abdomen.  You vomit repeatedly.  You have trouble breathing. Summary  Hypertension is when the force of blood pumping  through your arteries is too strong. If this condition is not controlled, it may put you at risk for serious complications.  Your personal target blood pressure may vary depending on your medical conditions, your age, and other factors. For most people, a normal blood pressure is less than 120/80.  Hypertension is managed by lifestyle changes, medicines, or both. Lifestyle changes include weight loss, eating a healthy, low-sodium diet, exercising more, and limiting alcohol. This information is not intended to replace advice given to you by your health care provider. Make sure you discuss any questions you have with your health care provider. Document Revised: 12/29/2018 Document Reviewed: 08/04/2016 Elsevier Patient Education  2020 [employer]. Chlorthalidone Oral Tablets What is this medicine? CHLORTHALIDONE (klor THAL i done) is a diuretic. It helps you make more urine and to lose salt and excess water from your body. It treats swelling from heart, kidney, or liver disease. It also treats high blood pressure. This medicine may be used for other purposes; ask your health care provider or pharmacist if you have questions. COMMON BRAND NAME(S): Thalitone What should I tell my health care provider before I take this medicine? They need to know if you have any of these conditions:  diabetes, high blood sugar  gout  kidney disease    liver disease  lung or breathing disease (asthma)  lupus  an unusual or allergic reaction to chlorthalidone, sulfa drugs, other drugs, foods, dyes or preservatives  pregnant or trying to get pregnant  breast-feeding How should I use this medicine? Take this drug by mouth. Take it as directed on the prescription label at the same time every day. Take it with food. Keep taking it unless your health care provider tells you to stop. Talk to your health care provider about the use of this drug in children. Special care may be needed. Overdosage: If you think  you have taken too much of this medicine contact a poison control center or emergency room at once. NOTE: This medicine is only for you. Do not share this medicine with others. What if I miss a dose? If you miss a dose, take it as soon as you can. If it is almost time for your next dose, take only that dose. Do not take double or extra doses. What may interact with this medicine?  barbiturate medicines for sleep or seizure control  digoxin  lithium  medicines for diabetes  norepinephrine  other medicines for high blood pressure  some pain medicines  steroid hormones like prednisone, cortisone, hydrocortisone, corticotropin  tubocurarine This list may not describe all possible interactions. Give your health care provider a list of all the medicines, herbs, non-prescription drugs, or dietary supplements you use. Also tell them if you smoke, drink alcohol, or use illegal drugs. Some items may interact with your medicine. What should I watch for while using this medicine? Visit your health care provider for regular checks on your progress. Check your blood pressure as directed. Ask your health care provider what your blood pressure should be. Also, find out when you should contact him or her. Check with your health care provider if you have severe diarrhea, nausea, and vomiting, or if you sweat a lot. The loss of too much body fluid may make it dangerous for you to take this drug. You may need to be on a special diet while you are taking this drug. Ask your health care provider. Also, find out how many glasses of fluids you need to drink each day. Do not treat yourself for coughs, colds, or pain while you are using this drug without asking your health care provider for advice. Some drugs may increase your blood pressure. You may get drowsy or dizzy. Do not drive, use machinery, or do anything that needs mental alertness until you know how this drug affects you. Do not stand up or sit up  quickly, especially if you are an older patient. This reduces the risk of dizzy or fainting spells. Alcohol may interfere with the effect of this drug. Avoid alcoholic drinks. What side effects may I notice from receiving this medicine? Side effects that you should report to your doctor or health care provider as soon as possible:  allergic reactions (skin rash, itching or hives; swelling of the face, lips, or tongue)  kidney injury (trouble passing urine or change in the amount of urine)  low blood pressure (dizziness; feeling faint or lightheaded, falls; unusually weak or tired)  low potassium levels (trouble breathing; chest pain; dizziness; fast, irregular heartbeat; feeling faint or lightheaded, falls; muscle cramps or pain)  redness, blistering, peeling, or loosening of the skin, including inside the mouth  unusual bruising or bleeding Side effects that usually do not require medical attention (report to your doctor or health care provider if they continue  or are bothersome):  constipation  diarrhea  headache  increased thirst  loss of appetite  low red blood cell counts (trouble breathing; feeling faint; lightheaded, falls; unusually weak or tired)  unusual sweating  vomiting This list may not describe all possible side effects. Call your doctor for medical advice about side effects. You may report side effects to FDA at 1-800-FDA-1088. Where should I keep my medicine? Keep out of the reach of children and pets. Store at room temperature between 20 and 25 degrees C (68 and 77 degrees F). Protect from light. Throw away any unused drug after the expiration date. NOTE: This sheet is a summary. It may not cover all possible information. If you have questions about this medicine, talk to your doctor, pharmacist, or health care provider.  2020 Elsevier/Gold Standard (2019-06-26 14:45:31)

## 2020-05-30 NOTE — Progress Notes (Signed)
Established Patient Office Visit  Subjective:  Patient ID: Brandon Bowman, male    DOB: 16-Feb-1981  Age: 39 y.o. MRN: [id number]  CC:  Chief Complaint  Patient presents with  . Follow-up    3 month follow up on BP, no concerns.     HPI Brandon Bowman presents for follow-up of his pressure.  Tolerating amlodipine well with minimal swelling in his legs.  Continues to drink alcohol daily.  He smokes cigars occasionally every couple weeks.  He does not use illicit drugs.  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  Strong family history of hypertension.  Denies headaches or difficulty with his vision.  History reviewed. No pertinent past medical history.  History reviewed. No pertinent surgical history.  History reviewed. No pertinent family history.  Social History   Socioeconomic History  . Marital status: Single    Spouse name: Not on file  . Number of children: Not on file  . Years of education: Not on file  . Highest education level: Not on file  Occupational History  . Not on file  Tobacco Use  . Smoking status: Current Some Day Smoker    Types: Cigars  . Smokeless tobacco: Never Used  Vaping Use  . Vaping Use: Never used  Substance and Sexual Activity  . Alcohol use: Yes    Comment: daily  . Drug use: No  . Sexual activity: Not on file  Other Topics Concern  . Not on file  Social History Narrative  . Not on file   Social Determinants of Health   Financial Resource Strain:   . Difficulty of Paying Living Expenses: Not on file  Food Insecurity:   . Worried About Programme researcher, broadcasting/film/video in the Last Year: Not on file  . Ran Out of Food in the Last Year: Not on file  Transportation Needs:   . Lack of Transportation (Medical): Not on file  . Lack of Transportation (Non-Medical): Not on file  Physical Activity:   . Days of Exercise per Week: Not on file  . Minutes of Exercise per Session: Not on file  Stress:   . Feeling of Stress : Not on  file  Social Connections:   . Frequency of Communication with Friends and Family: Not on file  . Frequency of Social Gatherings with Friends and Family: Not on file  . Attends Religious Services: Not on file  . Active Member of Clubs or Organizations: Not on file  . Attends Banker Meetings: Not on file  . Marital Status: Not on file  Intimate Partner Violence:   . Fear of Current or Ex-Partner: Not on file  . Emotionally Abused: Not on file  . Physically Abused: Not on file  . Sexually Abused: Not on file    Outpatient Medications Prior to Visit  Medication Sig Dispense Refill  . ibuprofen (ADVIL,MOTRIN) 800 MG tablet Take 1 tablet (800 mg total) by mouth every 8 (eight) hours as needed. 30 tablet 0  . amLODipine (NORVASC) 10 MG tablet TAKE 1 TABLET BY MOUTH EVERY DAY 90 tablet 0   No facility-administered medications prior to visit.    No Known Allergies  ROS Review of Systems  Constitutional: Negative.   HENT: Negative.   Eyes: Negative for photophobia and visual disturbance.  Respiratory: Negative.   Cardiovascular: Negative.   Gastrointestinal: Negative.   Neurological: Negative for headaches.  Psychiatric/Behavioral: Negative.       Objective:  Physical Exam Vitals and nursing note reviewed.  Constitutional:      General: He is not in acute distress.    Appearance: Normal appearance. He is not ill-appearing, toxic-appearing or diaphoretic.  HENT:     Head: Normocephalic and atraumatic.     Right Ear: External ear normal.     Left Ear: External ear normal.  Eyes:     General: No scleral icterus.       Right eye: No discharge.        Left eye: No discharge.     Extraocular Movements: Extraocular movements intact.     Conjunctiva/sclera: Conjunctivae normal.     Pupils: Pupils are equal, round, and reactive to light.  Cardiovascular:     Rate and Rhythm: Normal rate and regular rhythm.  Pulmonary:     Effort: Pulmonary effort is normal.      Breath sounds: Normal breath sounds.  Musculoskeletal:     Cervical back: No rigidity or tenderness.     Right lower leg: No edema.  Lymphadenopathy:     Cervical: No cervical adenopathy.  Skin:    General: Skin is warm and dry.  Neurological:     Mental Status: He is alert and oriented to person, place, and time.  Psychiatric:        Mood and Affect: Mood normal.        Behavior: Behavior normal.     BP (!) 152/90   Pulse 92   Temp 97.8 F (36.6 C) (Tympanic)   Ht 5\' 8"  (1.727 m)   Wt 217 lb 12.8 oz (98.8 kg)   SpO2 97%   BMI 33.12 kg/m  Wt Readings from Last 3 Encounters:  05/30/20 217 lb 12.8 oz (98.8 kg)  02/28/20 211 lb 6.4 oz (95.9 kg)  01/30/20 211 lb (95.7 kg)     Health Maintenance Due  Topic Date Due  . Hepatitis C Screening  Never done  . HIV Screening  Never done  . INFLUENZA VACCINE  Never done    There are no preventive care reminders to display for this patient.  No results found for: TSH Lab Results  Component Value Date   WBC 5.1 01/30/2020   HGB 14.4 01/30/2020   HCT 41.2 01/30/2020   MCV 80.8 01/30/2020   PLT 240 01/30/2020   Lab Results  Component Value Date   NA 134 (L) 01/30/2020   K 3.5 01/30/2020   CO2 23 01/30/2020   GLUCOSE 107 (H) 01/30/2020   BUN 18 01/30/2020   CREATININE 1.16 01/30/2020   CALCIUM 8.7 (L) 01/30/2020   ANIONGAP 8 01/30/2020   No results found for: CHOL No results found for: HDL No results found for: LDLCALC No results found for: TRIG No results found for: CHOLHDL No results found for: 03/31/2020    Assessment & Plan:   Problem List Items Addressed This Visit    None    Visit Diagnoses    Essential hypertension    -  Primary   Relevant Medications   chlorthalidone (HYGROTON) 25 MG tablet   amLODipine (NORVASC) 10 MG tablet      Meds ordered this encounter  Medications  . chlorthalidone (HYGROTON) 25 MG tablet    Sig: Take 1 tablet (25 mg total) by mouth daily.    Dispense:  90 tablet     Refill:  1  . amLODipine (NORVASC) 10 MG tablet    Sig: Take 1 tablet (10 mg total) by mouth daily.  Dispense:  90 tablet    Refill:  0    Follow-up: Return in about 3 months (around 08/29/2020).  We discussed moderating alcohol to no more to no more than 2 servings daily.  Asked him to avoid prepared foods and fast foods or adding salt to his food at the table.  Have added chlorthalidone.  We discussed potential side effects including ED and suggested that this may pass after a few weeks.  He will let me know if it does not.  We will send in an alternative medication if needed.  He will check and record his blood pressures over the next few months.  DOT is due in May.  Mliss Sax, MD

## 2020-06-25 DIAGNOSIS — Z3009 Encounter for other general counseling and advice on contraception: Secondary | ICD-10-CM | POA: Diagnosis not present

## 2020-09-01 ENCOUNTER — Ambulatory Visit: Payer: BC Managed Care – PPO | Admitting: Family Medicine

## 2020-10-02 ENCOUNTER — Ambulatory Visit: Payer: BC Managed Care – PPO | Admitting: Family Medicine

## 2020-10-14 ENCOUNTER — Encounter: Payer: Self-pay | Admitting: Family

## 2020-10-14 ENCOUNTER — Ambulatory Visit (INDEPENDENT_AMBULATORY_CARE_PROVIDER_SITE_OTHER): Payer: BC Managed Care – PPO | Admitting: Family

## 2020-10-14 ENCOUNTER — Other Ambulatory Visit: Payer: Self-pay

## 2020-10-14 VITALS — BP 136/78 | HR 87 | Temp 97.6°F | Ht 68.0 in | Wt 220.4 lb

## 2020-10-14 DIAGNOSIS — Z0000 Encounter for general adult medical examination without abnormal findings: Secondary | ICD-10-CM | POA: Diagnosis not present

## 2020-10-14 DIAGNOSIS — I10 Essential (primary) hypertension: Secondary | ICD-10-CM | POA: Diagnosis not present

## 2020-10-14 DIAGNOSIS — J302 Other seasonal allergic rhinitis: Secondary | ICD-10-CM

## 2020-10-14 LAB — CBC WITH DIFFERENTIAL/PLATELET
Basophils Absolute: 0.1 10*3/uL (ref 0.0–0.1)
Basophils Relative: 1.4 % (ref 0.0–3.0)
Eosinophils Absolute: 0.2 10*3/uL (ref 0.0–0.7)
Eosinophils Relative: 4.1 % (ref 0.0–5.0)
HCT: 39.5 % (ref 39.0–52.0)
Hemoglobin: 13.4 g/dL (ref 13.0–17.0)
Lymphocytes Relative: 29.3 % (ref 12.0–46.0)
Lymphs Abs: 1.4 10*3/uL (ref 0.7–4.0)
MCHC: 33.8 g/dL (ref 30.0–36.0)
MCV: 83 fl (ref 78.0–100.0)
Monocytes Absolute: 0.4 10*3/uL (ref 0.1–1.0)
Monocytes Relative: 8.5 % (ref 3.0–12.0)
Neutro Abs: 2.7 10*3/uL (ref 1.4–7.7)
Neutrophils Relative %: 56.7 % (ref 43.0–77.0)
Platelets: 256 10*3/uL (ref 150.0–400.0)
RBC: 4.76 Mil/uL (ref 4.22–5.81)
RDW: 13.6 % (ref 11.5–15.5)
WBC: 4.7 10*3/uL (ref 4.0–10.5)

## 2020-10-14 LAB — COMPREHENSIVE METABOLIC PANEL
ALT: 18 U/L (ref 0–53)
AST: 26 U/L (ref 0–37)
Albumin: 4.5 g/dL (ref 3.5–5.2)
Alkaline Phosphatase: 76 U/L (ref 39–117)
BUN: 15 mg/dL (ref 6–23)
CO2: 31 mEq/L (ref 19–32)
Calcium: 9.5 mg/dL (ref 8.4–10.5)
Chloride: 99 mEq/L (ref 96–112)
Creatinine, Ser: 1.13 mg/dL (ref 0.40–1.50)
GFR: 83.15 mL/min (ref >60.00)
Glucose, Bld: 110 mg/dL — ABNORMAL HIGH (ref 70–99)
Potassium: 4.1 mEq/L (ref 3.5–5.1)
Sodium: 135 mEq/L (ref 135–145)
Total Bilirubin: 0.5 mg/dL (ref 0.2–1.2)
Total Protein: 7.3 g/dL (ref 6.0–8.3)

## 2020-10-14 LAB — LIPID PANEL
Cholesterol: 243 mg/dL — ABNORMAL HIGH (ref 0–200)
HDL: 49.7 mg/dL (ref >39.00)
LDL Cholesterol: 155 mg/dL — ABNORMAL HIGH (ref 0–99)
NonHDL: 193.62
Total CHOL/HDL Ratio: 5
Triglycerides: 194 mg/dL — ABNORMAL HIGH (ref 0.0–149.0)
VLDL: 38.8 mg/dL (ref 0.0–40.0)

## 2020-10-14 MED ORDER — LEVOCETIRIZINE DIHYDROCHLORIDE 5 MG PO TABS: 5.0000 mg | ORAL_TABLET | Freq: Every evening | ORAL | 1 refills | Status: DC

## 2020-10-14 NOTE — Progress Notes (Signed)
Established Patient Office Visit  Subjective:  Patient ID: Brandon Bowman, male    DOB: 1980/11/26  Age: 40 y.o. MRN: [id number]  CC:  Chief Complaint  Patient presents with  . Annual Exam    CPE, no concerns.     HPI Brandon Bowman presents for CPX. Denies any concerns. Has a history of HTN, Seasonal Allergies. Tolerates meds well. Reports having occasional right wrist pain that he believes is related to work and heavy lifting. He has seen orthopedics for it. Work is repetitive.   He has to have a physical for work. Vaccinated but not yet boosted. Declines influenza vaccine. Declines STI screening   History reviewed. No pertinent past medical history.  History reviewed. No pertinent surgical history.  History reviewed. No pertinent family history.  Social History   Socioeconomic History  . Marital status: Single    Spouse name: Not on file  . Number of children: Not on file  . Years of education: Not on file  . Highest education level: Not on file  Occupational History  . Not on file  Tobacco Use  . Smoking status: Current Some Day Smoker    Types: Cigars  . Smokeless tobacco: Never Used  Vaping Use  . Vaping Use: Never used  Substance and Sexual Activity  . Alcohol use: Yes    Comment: daily  . Drug use: No  . Sexual activity: Not on file  Other Topics Concern  . Not on file  Social History Narrative  . Not on file   Social Determinants of Health   Financial Resource Strain: Not on file  Food Insecurity: Not on file  Transportation Needs: Not on file  Physical Activity: Not on file  Stress: Not on file  Social Connections: Not on file  Intimate Partner Violence: Not on file    Outpatient Medications Prior to Visit  Medication Sig Dispense Refill  . amLODipine (NORVASC) 10 MG tablet Take 1 tablet (10 mg total) by mouth daily. 90 tablet 0  . chlorthalidone (HYGROTON) 25 MG tablet Take 1 tablet (25 mg total) by mouth daily. 90 tablet 1  .  levocetirizine (XYZAL) 5 MG tablet Take 5 mg by mouth every evening.    Marland Kitchen ibuprofen (ADVIL,MOTRIN) 800 MG tablet Take 1 tablet (800 mg total) by mouth every 8 (eight) hours as needed. (Patient not taking: Reported on 10/14/2020) 30 tablet 0   No facility-administered medications prior to visit.    No Known Allergies  ROS Review of Systems  Constitutional: Negative.   HENT: Negative.   Eyes: Negative.   Respiratory: Negative.   Cardiovascular: Negative.   Endocrine: Negative.   Genitourinary: Negative.   Musculoskeletal: Positive for arthralgias. Negative for back pain, joint swelling, neck pain and neck stiffness.       Occasional wrist pain-right  Skin: Negative.   Neurological: Negative.   Hematological: Negative.   Psychiatric/Behavioral: Negative.   All other systems reviewed and are negative.     Objective:    Physical Exam Vitals reviewed.  Constitutional:      Appearance: Normal appearance. He is normal weight.  HENT:     Head: Normocephalic and atraumatic.     Right Ear: Tympanic membrane normal.     Left Ear: Tympanic membrane normal.     Nose: Nose normal.     Mouth/Throat:     Mouth: Mucous membranes are dry.  Eyes:     Extraocular Movements: Extraocular movements intact.     Conjunctiva/sclera:  Conjunctivae normal.     Pupils: Pupils are equal, round, and reactive to light.  Cardiovascular:     Rate and Rhythm: Normal rate and regular rhythm.     Pulses: Normal pulses.     Heart sounds: Normal heart sounds.  Pulmonary:     Effort: Pulmonary effort is normal.     Breath sounds: Normal breath sounds.  Abdominal:     General: Abdomen is flat. Bowel sounds are normal.     Palpations: Abdomen is soft. There is no mass.     Tenderness: There is no abdominal tenderness. There is no guarding.  Musculoskeletal:        General: Normal range of motion.     Cervical back: Normal range of motion and neck supple.  Skin:    General: Skin is warm and dry.   Neurological:     General: No focal deficit present.     Mental Status: He is alert and oriented to person, place, and time.     Cranial Nerves: No cranial nerve deficit.     Motor: No weakness.     Coordination: Coordination normal.  Psychiatric:        Mood and Affect: Mood normal.        Behavior: Behavior normal.     BP 136/78   Pulse 87   Temp 97.6 F (36.4 C) (Temporal)   Ht 5\' 8"  (1.727 m)   Wt 220 lb 6.4 oz (100 kg)   SpO2 96%   BMI 33.51 kg/m  Wt Readings from Last 3 Encounters:  10/14/20 220 lb 6.4 oz (100 kg)  05/30/20 217 lb 12.8 oz (98.8 kg)  02/28/20 211 lb 6.4 oz (95.9 kg)     Health Maintenance Due  Topic Date Due  . Hepatitis C Screening  Never done  . HIV Screening  Never done  . INFLUENZA VACCINE  Never done  . COVID-19 Vaccine (3 - Booster for Pfizer series) 07/10/2020    There are no preventive care reminders to display for this patient.  No results found for: TSH Lab Results  Component Value Date   WBC 5.1 01/30/2020   HGB 14.4 01/30/2020   HCT 41.2 01/30/2020   MCV 80.8 01/30/2020   PLT 240 01/30/2020   Lab Results  Component Value Date   NA 134 (L) 01/30/2020   K 3.5 01/30/2020   CO2 23 01/30/2020   GLUCOSE 107 (H) 01/30/2020   BUN 18 01/30/2020   CREATININE 1.16 01/30/2020   CALCIUM 8.7 (L) 01/30/2020   ANIONGAP 8 01/30/2020   No results found for: CHOL No results found for: HDL No results found for: LDLCALC No results found for: TRIG No results found for: CHOLHDL No results found for: 03/31/2020    Assessment & Plan:   Problem List Items Addressed This Visit   None   Visit Diagnoses    Routine general medical examination at a health care facility    -  Primary   Relevant Orders   CBC with Differential   Lipid panel   HIV antibody   Comprehensive metabolic panel   Hypertension, unspecified type       Relevant Orders   POCT urinalysis dipstick   Seasonal allergic rhinitis, unspecified trigger          Meds  ordered this encounter  Medications  . levocetirizine (XYZAL) 5 MG tablet    Sig: Take 1 tablet (5 mg total) by mouth every evening.    Dispense:  90  tablet    Refill:  1    Follow-up: Return in 6 months (on 04/13/2021).    Eulis Foster, FNP

## 2020-10-14 NOTE — Patient Instructions (Signed)
Health Maintenance, Male Adopting a healthy lifestyle and getting preventive care are important in promoting health and wellness. Ask your health care provider about:  The right schedule for you to have regular tests and exams.  Things you can do on your own to prevent diseases and keep yourself healthy. What should I know about diet, weight, and exercise? Eat a healthy diet  Eat a diet that includes plenty of vegetables, fruits, low-fat dairy products, and lean protein.  Do not eat a lot of foods that are high in solid fats, added sugars, or sodium.   Maintain a healthy weight Body mass index (BMI) is a measurement that can be used to identify possible weight problems. It estimates body fat based on height and weight. Your health care provider can help determine your BMI and help you achieve or maintain a healthy weight. Get regular exercise Get regular exercise. This is one of the most important things you can do for your health. Most adults should:  Exercise for at least 150 minutes each week. The exercise should increase your heart rate and make you sweat (moderate-intensity exercise).  Do strengthening exercises at least twice a week. This is in addition to the moderate-intensity exercise.  Spend less time sitting. Even light physical activity can be beneficial. Watch cholesterol and blood lipids Have your blood tested for lipids and cholesterol at 38 years of age, then have this test every 5 years. You may need to have your cholesterol levels checked more often if:  Your lipid or cholesterol levels are high.  You are older than 38 years of age.  You are at high risk for heart disease. What should I know about cancer screening? Many types of cancers can be detected early and may often be prevented. Depending on your health history and family history, you may need to have cancer screening at various ages. This may include screening for:  Colorectal cancer.  Prostate  cancer.  Skin cancer.  Lung cancer. What should I know about heart disease, diabetes, and high blood pressure? Blood pressure and heart disease  High blood pressure causes heart disease and increases the risk of stroke. This is more likely to develop in people who have high blood pressure readings, are of African descent, or are overweight.  Talk with your health care provider about your target blood pressure readings.  Have your blood pressure checked: ? Every 3-5 years if you are 55-22 years of age. ? Every year if you are 41 years old or older.  If you are between the ages of 16 and 56 and are a current or former smoker, ask your health care provider if you should have a one-time screening for abdominal aortic aneurysm (AAA). Diabetes Have regular diabetes screenings. This checks your fasting blood sugar level. Have the screening done:  Once every three years after age 70 if you are at a normal weight and have a low risk for diabetes.  More often and at a younger age if you are overweight or have a high risk for diabetes. What should I know about preventing infection? Hepatitis B If you have a higher risk for hepatitis B, you should be screened for this virus. Talk with your health care provider to find out if you are at risk for hepatitis B infection. Hepatitis C Blood testing is recommended for:  Everyone born from [birth year] through 1963.  Anyone with known risk factors for hepatitis C. Sexually transmitted infections (STIs)  You should be screened  each year for STIs, including gonorrhea and chlamydia, if: ? You are sexually active and are younger than 38 years of age. ? You are older than 38 years of age and your health care provider tells you that you are at risk for this type of infection. ? Your sexual activity has changed since you were last screened, and you are at increased risk for chlamydia or gonorrhea. Ask your health care provider if you are at risk.  Ask your  health care provider about whether you are at high risk for HIV. Your health care provider may recommend a prescription medicine to help prevent HIV infection. If you choose to take medicine to prevent HIV, you should first get tested for HIV. You should then be tested every 3 months for as long as you are taking the medicine. Follow these instructions at home: Lifestyle  Do not use any products that contain nicotine or tobacco, such as cigarettes, e-cigarettes, and chewing tobacco. If you need help quitting, ask your health care provider.  Do not use street drugs.  Do not share needles.  Ask your health care provider for help if you need support or information about quitting drugs. Alcohol use  Do not drink alcohol if your health care provider tells you not to drink.  If you drink alcohol: ? Limit how much you have to 0-2 drinks a day. ? Be aware of how much alcohol is in your drink. In the [place] one drink equals one 12 oz bottle of beer (355 mL), one 5 oz glass of wine (148 mL), or one 1 oz glass of hard liquor (44 mL). General instructions  Schedule regular health, dental, and eye exams.  Stay current with your vaccines.  Tell your health care provider if: ? You often feel depressed. ? You have ever been abused or do not feel safe at home. Summary  Adopting a healthy lifestyle and getting preventive care are important in promoting health and wellness.  Follow your health care provider's instructions about healthy diet, exercising, and getting tested or screened for diseases.  Follow your health care provider's instructions on monitoring your cholesterol and blood pressure. This information is not intended to replace advice given to you by your health care provider. Make sure you discuss any questions you have with your health care provider. Document Revised: 08/30/2018 Document Reviewed: 08/30/2018 Elsevier Patient Education  2021 [employer] Inc[employer]   Fat and Cholesterol  Restricted Eating Plan Eating a diet that limits fat and cholesterol may help lower your risk for heart disease and other conditions. Your body needs fat and cholesterol for basic functions, but eating too much of these things can be harmful to your health. Your health care provider may order lab tests to check your blood fat (lipid) and cholesterol levels. This helps your health care provider understand your risk for certain conditions and whether you need to make diet changes. Work with your health care provider or dietitian to make an eating plan that is right for you. Your plan includes:  Limit your fat intake to ______% or less of your total calories a day.  Limit your saturated fat intake to ______% or less of your total calories a day.  Limit the amount of cholesterol in your diet to less than _________mg a day.  Eat ___________ g of fiber a day. What are tips for following this plan? General guidelines  If you are overweight, work with your health care provider to lose weight safely. Losing just  5-10% of your body weight can improve your overall health and help prevent diseases such as diabetes and heart disease.  Avoid: ? Foods with added sugar. ? Fried foods. ? Foods that contain partially hydrogenated oils, including stick margarine, some tub margarines, cookies, crackers, and other baked goods.  Limit alcohol intake to no more than 1 drink a day for nonpregnant women and 2 drinks a day for men. One drink equals 12 oz of beer, 5 oz of wine, or 1 oz of hard liquor.   Reading food labels  Check food labels for: ? Trans fats, partially hydrogenated oils, or high amounts of saturated fat. Avoid foods that contain saturated fat and trans fat. ? The amount of cholesterol in each serving. Try to eat no more than 200 mg of cholesterol each day. ? The amount of fiber in each serving. Try to eat at least 20-30 g of fiber each day.  Choose foods with healthy fats, such  as: ? Monounsaturated and polyunsaturated fats. These include olive and canola oil, flaxseeds, walnuts, almonds, and seeds. ? Omega-3 fats. These are found in foods such as salmon, mackerel, sardines, tuna, flaxseed oil, and ground flaxseeds.  Choose grain products that have whole grains. Look for the word "whole" as the first word in the ingredient list. Cooking  Cook foods using methods other than frying. Baking, boiling, grilling, and broiling are some healthy options.  Eat more home-cooked food and less restaurant, buffet, and fast food.  Avoid cooking using saturated fats. ? Animal sources of saturated fats include meats, butter, and cream. ? Plant sources of saturated fats include palm oil, palm kernel oil, and coconut oil. Meal planning  At meals, imagine dividing your plate into fourths: ? Fill one-half of your plate with vegetables and green salads. ? Fill one-fourth of your plate with whole grains. ? Fill one-fourth of your plate with lean protein foods.  Eat fish that is high in omega-3 fats at least two times a week.  Eat more foods that contain fiber, such as whole grains, beans, apples, broccoli, carrots, peas, and barley. These foods help promote healthy cholesterol levels in the blood.   Recommended foods Grains  Whole grains, such as whole wheat or whole grain breads, crackers, cereals, and pasta. Unsweetened oatmeal, bulgur, barley, quinoa, or brown rice. Corn or whole wheat flour tortillas. Vegetables  Fresh or frozen vegetables (raw, steamed, roasted, or grilled). Green salads. Fruits  All fresh, canned (in natural juice), or frozen fruits. Meats and other protein foods  Ground beef (85% or leaner), grass-fed beef, or beef trimmed of fat. Skinless chicken or [place]. Ground chicken or [place]. Pork trimmed of fat. All fish and seafood. Egg whites. Dried beans, peas, or lentils. Unsalted nuts or seeds. Unsalted canned beans. Natural nut butters without added sugar  and oil. Dairy  Low-fat or nonfat dairy products, such as skim or 1% milk, 2% or reduced-fat cheeses, low-fat and fat-free ricotta or cottage cheese, or plain low-fat and nonfat yogurt. Fats and oils  Tub margarine without trans fats. Light or reduced-fat mayonnaise and salad dressings. Avocado. Olive, canola, sesame, or safflower oils. The items listed above may not be a complete list of foods and beverages you can eat. Contact a dietitian for more information. Foods to avoid Grains  White bread. White pasta. White rice. Cornbread. Bagels, pastries, and croissants. Crackers and snack foods that contain trans fat and hydrogenated oils. Vegetables  Vegetables cooked in cheese, cream, or butter sauce. Fried vegetables. Fruits  Canned fruit in heavy syrup. Fruit in cream or butter sauce. Fried fruit. Meats and other protein foods  Fatty cuts of meat. Ribs, chicken wings, bacon, sausage, bologna, salami, chitterlings, fatback, hot dogs, bratwurst, and packaged lunch meats. Liver and organ meats. Whole eggs and egg yolks. Chicken and [place] with skin. Fried meat. Dairy  Whole or 2% milk, cream, half-and-half, and cream cheese. Whole milk cheeses. Whole-fat or sweetened yogurt. Full-fat cheeses. Nondairy creamers and whipped toppings. Processed cheese, cheese spreads, and cheese curds. Beverages  Alcohol. Sugar-sweetened drinks such as sodas, lemonade, and fruit drinks. Fats and oils  Butter, stick margarine, lard, shortening, ghee, or bacon fat. Coconut, palm kernel, and palm oils. Sweets and desserts  Corn syrup, sugars, honey, and molasses. Candy. Jam and jelly. Syrup. Sweetened cereals. Cookies, pies, cakes, donuts, muffins, and ice cream. The items listed above may not be a complete list of foods and beverages you should avoid. Contact a dietitian for more information. Summary  Your body needs fat and cholesterol for basic functions. However, eating too much of these things can be  harmful to your health.  Work with your health care provider and dietitian to follow a diet low in fat and cholesterol. Doing this may help lower your risk for heart disease and other conditions.  Choose healthy fats, such as monounsaturated and polyunsaturated fats, and foods high in omega-3 fatty acids.  Eat fiber-rich foods, such as whole grains, beans, peas, fruits, and vegetables.  Limit or avoid alcohol, fried foods, and foods high in saturated fats, partially hydrogenated oils, and sugar. This information is not intended to replace advice given to you by your health care provider. Make sure you discuss any questions you have with your health care provider. Document Revised: 05/07/2020 Document Reviewed: 01/09/2020 Elsevier Patient Education  2021 [employer].

## 2020-10-15 LAB — HIV ANTIBODY (ROUTINE TESTING W REFLEX): HIV 1&2 Ab, 4th Generation: NONREACTIVE

## 2020-11-05 ENCOUNTER — Ambulatory Visit: Payer: BC Managed Care – PPO | Admitting: Family Medicine

## 2020-11-15 ENCOUNTER — Other Ambulatory Visit: Payer: Self-pay | Admitting: Family Medicine

## 2020-11-15 DIAGNOSIS — I10 Essential (primary) hypertension: Secondary | ICD-10-CM

## 2021-02-23 ENCOUNTER — Telehealth: Payer: Self-pay

## 2021-02-23 DIAGNOSIS — I10 Essential (primary) hypertension: Secondary | ICD-10-CM

## 2021-02-23 MED ORDER — AMLODIPINE BESYLATE 10 MG PO TABS: 1.0000 | ORAL_TABLET | Freq: Every day | ORAL | 0 refills | Status: DC

## 2021-02-23 NOTE — Telephone Encounter (Signed)
Last OV 10/14/20 w/[facility] Last fill 11/16/20  #90/0

## 2021-05-19 ENCOUNTER — Other Ambulatory Visit: Payer: Self-pay | Admitting: Family Medicine

## 2021-05-19 DIAGNOSIS — I10 Essential (primary) hypertension: Secondary | ICD-10-CM

## 2021-05-20 NOTE — Telephone Encounter (Signed)
Called and LVM, Patient needs appt before any future refills. Sw, cma

## 2021-06-14 ENCOUNTER — Other Ambulatory Visit: Payer: Self-pay | Admitting: Family Medicine

## 2021-06-14 DIAGNOSIS — I10 Essential (primary) hypertension: Secondary | ICD-10-CM

## 2021-06-19 NOTE — Telephone Encounter (Signed)
Left VM to rtn call to schedule an appointment.  Dm/cma  

## 2021-07-12 ENCOUNTER — Other Ambulatory Visit: Payer: Self-pay | Admitting: Family Medicine

## 2021-07-12 DIAGNOSIS — I10 Essential (primary) hypertension: Secondary | ICD-10-CM

## 2021-07-13 ENCOUNTER — Other Ambulatory Visit: Payer: Self-pay | Admitting: Family Medicine

## 2021-07-13 DIAGNOSIS — I10 Essential (primary) hypertension: Secondary | ICD-10-CM

## 2021-07-16 ENCOUNTER — Telehealth: Payer: Self-pay | Admitting: Family Medicine

## 2021-07-16 NOTE — Telephone Encounter (Signed)
Pt has appt for med check 11/10, wondering if he can have enough bp medicine until then

## 2021-07-16 NOTE — Telephone Encounter (Signed)
Patient aware that Rx sent to pharmacy per patient he will give them a call

## 2021-07-16 NOTE — Telephone Encounter (Signed)
Called patient to inform that BP medication was sent in on 07/12/21 making sure he did not need any other meds. No answer LMTCB

## 2021-07-29 ENCOUNTER — Other Ambulatory Visit: Payer: Self-pay

## 2021-07-30 ENCOUNTER — Ambulatory Visit: Payer: BC Managed Care – PPO | Admitting: Family Medicine

## 2021-07-30 ENCOUNTER — Encounter: Payer: Self-pay | Admitting: Family Medicine

## 2021-07-30 VITALS — BP 160/82 | HR 86 | Temp 98.2°F | Ht 68.0 in | Wt 205.0 lb

## 2021-07-30 DIAGNOSIS — T887XXA Unspecified adverse effect of drug or medicament, initial encounter: Secondary | ICD-10-CM | POA: Diagnosis not present

## 2021-07-30 DIAGNOSIS — J309 Allergic rhinitis, unspecified: Secondary | ICD-10-CM | POA: Diagnosis not present

## 2021-07-30 DIAGNOSIS — I10 Essential (primary) hypertension: Secondary | ICD-10-CM

## 2021-07-30 MED ORDER — LEVOCETIRIZINE DIHYDROCHLORIDE 5 MG PO TABS: 5.0000 mg | ORAL_TABLET | Freq: Every evening | ORAL | 1 refills | Status: DC

## 2021-07-30 MED ORDER — AMLODIPINE BESYLATE 10 MG PO TABS: 10.0000 mg | ORAL_TABLET | Freq: Every day | ORAL | 0 refills | Status: DC

## 2021-07-30 MED ORDER — SPIRONOLACTONE 25 MG PO TABS: 25.0000 mg | ORAL_TABLET | Freq: Every day | ORAL | 0 refills | Status: DC

## 2021-07-30 MED ORDER — LISINOPRIL 10 MG PO TABS: 10.0000 mg | ORAL_TABLET | Freq: Every day | ORAL | 0 refills | Status: DC

## 2021-07-30 NOTE — Progress Notes (Signed)
Established Patient Office Visit  Subjective:  Patient ID: Brandon Bowman, male    DOB: Jun 17, 1981  Age: 40 y.o. MRN: [id number]  CC:  Chief Complaint  Patient presents with   Follow-up    Follow up on BP and medications.     HPI Brandon Bowman presents for follow-up of hypertension.  He has been taking both of his medications.  Admits to some issue with ED with chlorthalidone.  Would like to change.  Has occasional bitemporal headaches.  They have not been progressive.  Denies any lightheadedness, dizziness, visual changes or difficulty swallowing.  He does smoke cigars on occasion.  Occasional alcohol consumption.  Work is been stressful.  He does enjoy fast foods and packaged foods.  He does not use illicit drugs.  Strong family history of hypertension on both sides.  No past medical history on file.  No past surgical history on file.  No family history on file.  Social History   Socioeconomic History   Marital status: Single    Spouse name: Not on file   Number of children: Not on file   Years of education: Not on file   Highest education level: Not on file  Occupational History   Not on file  Tobacco Use   Smoking status: Some Days    Types: Cigars   Smokeless tobacco: Never  Vaping Use   Vaping Use: Never used  Substance and Sexual Activity   Alcohol use: Yes    Comment: daily   Drug use: No   Sexual activity: Not on file  Other Topics Concern   Not on file  Social History Narrative   Not on file   Social Determinants of Health   Financial Resource Strain: Not on file  Food Insecurity: Not on file  Transportation Needs: Not on file  Physical Activity: Not on file  Stress: Not on file  Social Connections: Not on file  Intimate Partner Violence: Not on file    Outpatient Medications Prior to Visit  Medication Sig Dispense Refill   ibuprofen (ADVIL,MOTRIN) 800 MG tablet Take 1 tablet (800 mg total) by mouth every 8 (eight) hours as needed. 30  tablet 0   amLODipine (NORVASC) 10 MG tablet TAKE 1 TABLET BY MOUTH EVERY DAY 30 tablet 1   chlorthalidone (HYGROTON) 25 MG tablet Take 1 tablet (25 mg total) by mouth daily. 90 tablet 1   levocetirizine (XYZAL) 5 MG tablet Take 1 tablet (5 mg total) by mouth every evening. 90 tablet 1   No facility-administered medications prior to visit.    Allergies  Allergen Reactions   Chlorthalidone Other (See Comments)    ED    ROS Review of Systems  Constitutional:  Negative for diaphoresis, fatigue, fever and unexpected weight change.  HENT:  Positive for congestion and postnasal drip.   Eyes:  Negative for photophobia and visual disturbance.  Respiratory:  Negative for chest tightness, shortness of breath and wheezing.   Cardiovascular:  Negative for chest pain and palpitations.  Gastrointestinal: Negative.   Endocrine: Negative for polyphagia and polyuria.  Genitourinary:  Negative for difficulty urinating, frequency and urgency.  Musculoskeletal:  Negative for gait problem and joint swelling.  Neurological:  Positive for headaches. Negative for dizziness, facial asymmetry, speech difficulty, weakness and light-headedness.  Psychiatric/Behavioral: Negative.       Objective:    Physical Exam Vitals and nursing note reviewed.  Constitutional:      General: He is not in acute distress.  Appearance: Normal appearance. He is not ill-appearing, toxic-appearing or diaphoretic.  HENT:     Head: Normocephalic and atraumatic.     Right Ear: Tympanic membrane, ear canal and external ear normal.     Left Ear: Tympanic membrane, ear canal and external ear normal.     Mouth/Throat:     Mouth: Mucous membranes are moist.     Pharynx: Oropharynx is clear. No oropharyngeal exudate or posterior oropharyngeal erythema.  Eyes:     General:        Right eye: No discharge.        Left eye: No discharge.     Extraocular Movements: Extraocular movements intact.     Conjunctiva/sclera:  Conjunctivae normal.     Pupils: Pupils are equal, round, and reactive to light.  Cardiovascular:     Rate and Rhythm: Normal rate and regular rhythm.  Pulmonary:     Effort: Pulmonary effort is normal.     Breath sounds: Normal breath sounds.  Abdominal:     General: Bowel sounds are normal.  Musculoskeletal:     Cervical back: No rigidity or tenderness.  Lymphadenopathy:     Cervical: No cervical adenopathy.  Skin:    General: Skin is warm and dry.  Neurological:     Mental Status: He is alert and oriented to person, place, and time.     Cranial Nerves: No dysarthria or facial asymmetry.  Psychiatric:        Mood and Affect: Mood normal.        Behavior: Behavior normal.    BP (!) 160/82 (BP Location: Left Arm, Patient Position: Sitting, Cuff Size: Normal)   Pulse 86   Temp 98.2 F (36.8 C) (Temporal)   Ht 5\' 8"  (1.727 m)   Wt 205 lb (93 kg)   SpO2 99%   BMI 31.17 kg/m  Wt Readings from Last 3 Encounters:  07/30/21 205 lb (93 kg)  10/14/20 220 lb 6.4 oz (100 kg)  05/30/20 217 lb 12.8 oz (98.8 kg)     Health Maintenance Due  Topic Date Due   Pneumococcal Vaccine 38-8 Years old (1 - PCV) Never done   Hepatitis C Screening  Never done    There are no preventive care reminders to display for this patient.  No results found for: TSH Lab Results  Component Value Date   WBC 4.7 10/14/2020   HGB 13.4 10/14/2020   HCT 39.5 10/14/2020   MCV 83.0 10/14/2020   PLT 256.0 10/14/2020   Lab Results  Component Value Date   NA 135 10/14/2020   K 4.1 10/14/2020   CO2 31 10/14/2020   GLUCOSE 110 (H) 10/14/2020   BUN 15 10/14/2020   CREATININE 1.13 10/14/2020   BILITOT 0.5 10/14/2020   ALKPHOS 76 10/14/2020   AST 26 10/14/2020   ALT 18 10/14/2020   PROT 7.3 10/14/2020   ALBUMIN 4.5 10/14/2020   CALCIUM 9.5 10/14/2020   ANIONGAP 8 01/30/2020   GFR 83.15 10/14/2020   Lab Results  Component Value Date   CHOL 243 (H) 10/14/2020   Lab Results  Component Value  Date   HDL 49.70 10/14/2020   Lab Results  Component Value Date   LDLCALC 155 (H) 10/14/2020   Lab Results  Component Value Date   TRIG 194.0 (H) 10/14/2020   Lab Results  Component Value Date   CHOLHDL 5 10/14/2020   No results found for: HGBA1C    Assessment & Plan:   Problem List Items  Addressed This Visit       Cardiovascular and Mediastinum   Essential hypertension   Relevant Medications   amLODipine (NORVASC) 10 MG tablet   spironolactone (ALDACTONE) 25 MG tablet   lisinopril (ZESTRIL) 10 MG tablet   Other Relevant Orders   Basic metabolic panel     Respiratory   Allergic rhinitis   Relevant Medications   levocetirizine (XYZAL) 5 MG tablet     Other   Medication side effect - Primary    Meds ordered this encounter  Medications   amLODipine (NORVASC) 10 MG tablet    Sig: Take 1 tablet (10 mg total) by mouth daily.    Dispense:  90 tablet    Refill:  0   levocetirizine (XYZAL) 5 MG tablet    Sig: Take 1 tablet (5 mg total) by mouth every evening.    Dispense:  90 tablet    Refill:  1   spironolactone (ALDACTONE) 25 MG tablet    Sig: Take 1 tablet (25 mg total) by mouth daily.    Dispense:  90 tablet    Refill:  0   lisinopril (ZESTRIL) 10 MG tablet    Sig: Take 1 tablet (10 mg total) by mouth daily.    Dispense:  90 tablet    Refill:  0    Follow-up: Return in about 3 months (around 10/30/2021), or 2-3 months. Check and record BPs..  Have discontinued chlorthalidone in favor of Aldactone.  Have added lisinopril we will continue amlodipine.  Advised that alcohol, tobacco and salt can all affect blood pressure negatively.  He was given information on controlling high blood pressure.  Discussed health risks associated with high blood pressure.  Libby Maw, MD

## 2021-07-31 LAB — BASIC METABOLIC PANEL
BUN: 16 mg/dL (ref 6–23)
CO2: 30 mEq/L (ref 19–32)
Calcium: 9.2 mg/dL (ref 8.4–10.5)
Chloride: 100 mEq/L (ref 96–112)
Creatinine, Ser: 1.19 mg/dL (ref 0.40–1.50)
GFR: 77.71 mL/min (ref >60.00)
Glucose, Bld: 78 mg/dL (ref 70–99)
Potassium: 3.5 mEq/L (ref 3.5–5.1)
Sodium: 137 mEq/L (ref 135–145)

## 2021-09-24 ENCOUNTER — Telehealth: Payer: Self-pay | Admitting: Family Medicine

## 2021-09-24 DIAGNOSIS — I10 Essential (primary) hypertension: Secondary | ICD-10-CM

## 2021-09-24 MED ORDER — OLMESARTAN MEDOXOMIL 20 MG PO TABS: 20.0000 mg | ORAL_TABLET | Freq: Every day | ORAL | 0 refills | Status: DC

## 2021-09-24 NOTE — Telephone Encounter (Signed)
Pt says he has started a new prescription on 07/30/21, it's not Amlodipine), he's not sure which one. He feels it this has given him a cough. He says Dr. Doreene Burke told him this may be a side effect and to call back. Please advise pt at [phone].

## 2021-09-24 NOTE — Telephone Encounter (Signed)
Please advise message below  °

## 2021-09-25 NOTE — Telephone Encounter (Signed)
Patient aware and will stop old Rx pick up new and start.

## 2021-10-24 ENCOUNTER — Other Ambulatory Visit: Payer: Self-pay | Admitting: Family Medicine

## 2021-10-24 DIAGNOSIS — I10 Essential (primary) hypertension: Secondary | ICD-10-CM

## 2021-10-30 ENCOUNTER — Ambulatory Visit: Payer: BC Managed Care – PPO | Admitting: Family Medicine

## 2021-11-02 ENCOUNTER — Telehealth: Payer: Self-pay | Admitting: Family Medicine

## 2021-11-02 ENCOUNTER — Ambulatory Visit: Payer: BC Managed Care – PPO | Admitting: Family Medicine

## 2021-11-02 NOTE — Telephone Encounter (Signed)
Pt did not show up for his 11/02/21 appointment with Dr. Doreene Burke. I sent a No Show letter.

## 2021-11-27 ENCOUNTER — Ambulatory Visit: Payer: BC Managed Care – PPO | Admitting: Family Medicine

## 2021-11-30 ENCOUNTER — Encounter: Payer: Self-pay | Admitting: Family Medicine

## 2021-11-30 ENCOUNTER — Other Ambulatory Visit: Payer: Self-pay

## 2021-11-30 ENCOUNTER — Ambulatory Visit: Payer: BC Managed Care – PPO | Admitting: Family Medicine

## 2021-11-30 ENCOUNTER — Ambulatory Visit (INDEPENDENT_AMBULATORY_CARE_PROVIDER_SITE_OTHER): Payer: BC Managed Care – PPO

## 2021-11-30 VITALS — BP 136/74 | HR 90 | Temp 97.6°F | Ht 68.0 in | Wt 206.8 lb

## 2021-11-30 DIAGNOSIS — M25559 Pain in unspecified hip: Secondary | ICD-10-CM | POA: Insufficient documentation

## 2021-11-30 DIAGNOSIS — I10 Essential (primary) hypertension: Secondary | ICD-10-CM

## 2021-11-30 DIAGNOSIS — S76219A Strain of adductor muscle, fascia and tendon of unspecified thigh, initial encounter: Secondary | ICD-10-CM | POA: Insufficient documentation

## 2021-11-30 DIAGNOSIS — S76212A Strain of adductor muscle, fascia and tendon of left thigh, initial encounter: Secondary | ICD-10-CM

## 2021-11-30 MED ORDER — SPIRONOLACTONE 25 MG PO TABS: 25.0000 mg | ORAL_TABLET | Freq: Every day | ORAL | 3 refills | Status: DC

## 2021-11-30 NOTE — Progress Notes (Signed)
Established Patient Office Visit  Subjective:  Patient ID: Brandon Bowman, male    DOB: 11/23/1980  Age: 41 y.o. MRN: [id number]  CC:  Chief Complaint  Patient presents with   Follow-up    3 month follow up, states there is pain in groan area that come and go mostly while working. Patient not fasting.     HPI Brandon Bowman presents for follow-up of hypertension controlled with amlodipine, olmesartan and spironolactone.  Tolerating the medications well.  Reports pain in his left groin area.  There is some pain in the left buttock as well.  There was no injury.  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  Denies back pain, weakness or paresthesias.  No past medical history on file.  No past surgical history on file.  No family history on file.  Social History   Socioeconomic History   Marital status: Single    Spouse name: Not on file   Number of children: Not on file   Years of education: Not on file   Highest education level: Not on file  Occupational History   Not on file  Tobacco Use   Smoking status: Some Days    Types: Cigars   Smokeless tobacco: Never  Vaping Use   Vaping Use: Never used  Substance and Sexual Activity   Alcohol use: Yes    Comment: daily   Drug use: No   Sexual activity: Not on file  Other Topics Concern   Not on file  Social History Narrative   Not on file   Social Determinants of Health   Financial Resource Strain: Not on file  Food Insecurity: Not on file  Transportation Needs: Not on file  Physical Activity: Not on file  Stress: Not on file  Social Connections: Not on file  Intimate Partner Violence: Not on file    Outpatient Medications Prior to Visit  Medication Sig Dispense Refill   amLODipine (NORVASC) 10 MG tablet TAKE 1 TABLET BY MOUTH EVERY DAY 90 tablet 0   ibuprofen (ADVIL,MOTRIN) 800 MG tablet Take 1 tablet (800 mg total) by mouth every 8 (eight) hours as needed. 30 tablet 0   levocetirizine (XYZAL) 5  MG tablet Take 1 tablet (5 mg total) by mouth every evening. 90 tablet 1   olmesartan (BENICAR) 20 MG tablet Take 1 tablet (20 mg total) by mouth daily. 90 tablet 0   spironolactone (ALDACTONE) 25 MG tablet Take 1 tablet (25 mg total) by mouth daily. 90 tablet 0   No facility-administered medications prior to visit.    Allergies  Allergen Reactions   Chlorthalidone Other (See Comments)    ED    ROS Review of Systems  Constitutional:  Negative for diaphoresis, fatigue, fever and unexpected weight change.  Respiratory: Negative.    Cardiovascular: Negative.   Gastrointestinal: Negative.   Genitourinary: Negative.   Musculoskeletal:  Negative for back pain.  Neurological:  Negative for speech difficulty, weakness and numbness.     Objective:    Physical Exam Vitals and nursing note reviewed.  Constitutional:      Appearance: Normal appearance.  HENT:     Head: Normocephalic and atraumatic.     Right Ear: External ear normal.     Left Ear: External ear normal.     Mouth/Throat:     Pharynx: Oropharynx is clear. No oropharyngeal exudate or posterior oropharyngeal erythema.  Eyes:     General: No scleral icterus.  Right eye: No discharge.        Left eye: No discharge.     Extraocular Movements: Extraocular movements intact.     Conjunctiva/sclera: Conjunctivae normal.     Pupils: Pupils are equal, round, and reactive to light.  Cardiovascular:     Rate and Rhythm: Normal rate and regular rhythm.  Pulmonary:     Effort: Pulmonary effort is normal.     Breath sounds: Normal breath sounds.  Abdominal:     General: Bowel sounds are normal.     Tenderness: There is no abdominal tenderness.     Hernia: There is no hernia in the left inguinal area or right inguinal area.  Genitourinary:    Penis: Circumcised. No hypospadias, erythema, tenderness, discharge, swelling or lesions.      Testes:        Right: Mass, tenderness or swelling not present. 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        Left: Mass, tenderness or swelling not present. Left testis is descended.     Epididymis:     Right: Not inflamed or enlarged.     Left: Not inflamed or enlarged.  Musculoskeletal:     Lumbar back: Normal range of motion.     Right hip: Normal. Normal range of motion. Normal strength.     Left hip: Decreased range of motion. Normal strength.       Legs:  Lymphadenopathy:     Lower Body: No right inguinal adenopathy. No left inguinal adenopathy.  Skin:    General: Skin is warm and dry.  Neurological:     Mental Status: He is alert and oriented to person, place, and time.  Psychiatric:        Mood and Affect: Mood normal.        Behavior: Behavior normal.    BP 136/74 (BP Location: Right Arm, Patient Position: Sitting, Cuff Size: Large)    Pulse 90    Temp 97.6 F (36.4 C) (Temporal)    Ht 5\' 8"  (1.727 m)    Wt 206 lb 12.8 oz (93.8 kg)    SpO2 98%    BMI 31.44 kg/m  Wt Readings from Last 3 Encounters:  11/30/21 206 lb 12.8 oz (93.8 kg)  07/30/21 205 lb (93 kg)  10/14/20 220 lb 6.4 oz (100 kg)     Health Maintenance Due  Topic Date Due   Hepatitis C Screening  Never done    There are no preventive care reminders to display for this patient.  No results found for: TSH Lab Results  Component Value Date   WBC 4.7 10/14/2020   HGB 13.4 10/14/2020   HCT 39.5 10/14/2020   MCV 83.0 10/14/2020   PLT 256.0 10/14/2020   Lab Results  Component Value Date   NA 137 07/30/2021   K 3.5 07/30/2021   CO2 30 07/30/2021   GLUCOSE 78 07/30/2021   BUN 16 07/30/2021   CREATININE 1.19 07/30/2021   BILITOT 0.5 10/14/2020   ALKPHOS 76 10/14/2020   AST 26 10/14/2020   ALT 18 10/14/2020   PROT 7.3 10/14/2020   ALBUMIN 4.5 10/14/2020   CALCIUM 9.2 07/30/2021   ANIONGAP 8 01/30/2020   GFR 77.71 07/30/2021   Lab Results  Component Value Date   CHOL 243 (H) 10/14/2020   Lab Results  Component Value Date   HDL 49.70 10/14/2020   Lab Results  Component Value  Date   LDLCALC 155 (H) 10/14/2020   Lab Results  Component Value  Date   TRIG 194.0 (H) 10/14/2020   Lab Results  Component Value Date   CHOLHDL 5 10/14/2020   No results found for: HGBA1C    Assessment & Plan:   Problem List Items Addressed This Visit       Cardiovascular and Mediastinum   Essential hypertension - Primary   Relevant Medications   spironolactone (ALDACTONE) 25 MG tablet   Other Relevant Orders   Basic metabolic panel     Musculoskeletal and Integument   Groin strain     Other   Hip pain   Relevant Orders   DG Hip Unilat W OR W/O Pelvis 2-3 Views Left   The ASCVD Risk score (Arnett DK, et al., 2019) failed to calculate for the following reasons:   The 2019 ASCVD risk score is only valid for ages 32 to 77  Meds ordered this encounter  Medications   spironolactone (ALDACTONE) 25 MG tablet    Sig: Take 1 tablet (25 mg total) by mouth daily.    Dispense:  90 tablet    Refill:  3    Follow-up: Return in about 6 months (around 06/02/2022), or Return in 6 months for follow-up of hypertension and physical exam.  Sooner if needed..  We will try to make more frequent trips with his hand truck with less weight.  Continue all blood pressure medicines.  Checking potassium and renal function.  Mliss Sax, MD

## 2021-12-01 LAB — BASIC METABOLIC PANEL
BUN: 17 mg/dL (ref 6–23)
CO2: 27 mEq/L (ref 19–32)
Calcium: 9.3 mg/dL (ref 8.4–10.5)
Chloride: 100 mEq/L (ref 96–112)
Creatinine, Ser: 1.54 mg/dL — ABNORMAL HIGH (ref 0.40–1.50)
GFR: 56.9 mL/min — ABNORMAL LOW (ref >60.00)
Glucose, Bld: 84 mg/dL (ref 70–99)
Potassium: 4.1 mEq/L (ref 3.5–5.1)
Sodium: 135 mEq/L (ref 135–145)

## 2021-12-01 NOTE — Telephone Encounter (Signed)
1st no show, fee waived, letter was mailed ?

## 2021-12-23 ENCOUNTER — Other Ambulatory Visit: Payer: Self-pay | Admitting: Family Medicine

## 2021-12-23 DIAGNOSIS — I10 Essential (primary) hypertension: Secondary | ICD-10-CM

## 2022-01-28 ENCOUNTER — Other Ambulatory Visit: Payer: Self-pay | Admitting: Family Medicine

## 2022-01-28 DIAGNOSIS — I10 Essential (primary) hypertension: Secondary | ICD-10-CM

## 2022-01-28 DIAGNOSIS — J309 Allergic rhinitis, unspecified: Secondary | ICD-10-CM

## 2022-04-28 ENCOUNTER — Other Ambulatory Visit: Payer: Self-pay | Admitting: Family Medicine

## 2022-04-28 DIAGNOSIS — I10 Essential (primary) hypertension: Secondary | ICD-10-CM

## 2022-06-07 ENCOUNTER — Ambulatory Visit: Payer: BC Managed Care – PPO | Admitting: Family Medicine

## 2022-06-07 ENCOUNTER — Encounter: Payer: Self-pay | Admitting: Family Medicine

## 2022-06-07 VITALS — BP 122/74 | HR 85 | Temp 97.6°F | Ht 70.0 in | Wt 205.0 lb

## 2022-06-07 DIAGNOSIS — I10 Essential (primary) hypertension: Secondary | ICD-10-CM

## 2022-06-07 DIAGNOSIS — M67431 Ganglion, right wrist: Secondary | ICD-10-CM | POA: Diagnosis not present

## 2022-06-07 MED ORDER — SPIRONOLACTONE 25 MG PO TABS: 25.0000 mg | ORAL_TABLET | Freq: Every day | ORAL | 3 refills | Status: DC

## 2022-06-07 MED ORDER — AMLODIPINE BESYLATE 10 MG PO TABS: 10.0000 mg | ORAL_TABLET | Freq: Every day | ORAL | 2 refills | Status: DC

## 2022-06-07 MED ORDER — OLMESARTAN MEDOXOMIL 20 MG PO TABS: 20.0000 mg | ORAL_TABLET | Freq: Every day | ORAL | 2 refills | Status: DC

## 2022-06-07 NOTE — Progress Notes (Signed)
Established Patient Office Visit  Subjective   Patient ID: Brandon Bowman, male    DOB: 08-31-1981  Age: 41 y.o. MRN: [id number]  Chief Complaint  Patient presents with   Cyst    Knot on right wrist x 1 month becoming larger in size. Little pain sometimes.     HPI follow-up of hypertension, decreased renal function and a recent swelling in his right wrist.  He is left-hand dominant.  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  There was no injury.  Lump is relatively pain less.  He is tolerating his blood pressure medicines well.    Review of Systems  Constitutional: Negative.   HENT: Negative.    Eyes:  Negative for blurred vision, discharge and redness.  Respiratory: Negative.    Cardiovascular: Negative.   Gastrointestinal:  Negative for abdominal pain.  Genitourinary: Negative.   Musculoskeletal: Negative.  Negative for joint pain and myalgias.  Skin:  Negative for rash.  Neurological:  Negative for dizziness, tingling, loss of consciousness, weakness and headaches.  Endo/Heme/Allergies:  Negative for polydipsia.      Objective:     BP 122/74 (BP Location: Left Arm, Patient Position: Sitting, Cuff Size: Large)   Pulse 85   Temp 97.6 F (36.4 C) (Temporal)   Ht 5\' 10"  (1.778 m)   Wt 205 lb (93 kg)   SpO2 98%   BMI 29.41 kg/m    Physical Exam Constitutional:      General: He is not in acute distress.    Appearance: Normal appearance. He is not ill-appearing, toxic-appearing or diaphoretic.  HENT:     Head: Normocephalic and atraumatic.     Right Ear: External ear normal.     Left Ear: External ear normal.     Mouth/Throat:     Pharynx: No posterior oropharyngeal erythema.  Eyes:     General: No scleral icterus.       Right eye: No discharge.        Left eye: No discharge.     Extraocular Movements: Extraocular movements intact.     Conjunctiva/sclera: Conjunctivae normal.  Cardiovascular:     Rate and Rhythm: Normal rate and regular rhythm.  Pulmonary:      Effort: Pulmonary effort is normal. No respiratory distress.     Breath sounds: Normal breath sounds.  Musculoskeletal:     Right wrist: Swelling and effusion present. No deformity, lacerations, tenderness or bony tenderness. Normal range of motion.       Arms:  Skin:    General: Skin is warm and dry.  Neurological:     Mental Status: He is alert and oriented to person, place, and time.  Psychiatric:        Mood and Affect: Mood normal.        Behavior: Behavior normal.      No results found for any visits on 06/07/22.    The ASCVD Risk score (Arnett DK, et al., 2019) failed to calculate for the following reasons:   The 2019 ASCVD risk score is only valid for ages 33 to 74    Assessment & Plan:   Problem List Items Addressed This Visit       Cardiovascular and Mediastinum   Essential hypertension - Primary   Relevant Medications   spironolactone (ALDACTONE) 25 MG tablet   olmesartan (BENICAR) 20 MG tablet   amLODipine (NORVASC) 10 MG tablet   Other Relevant Orders   Basic metabolic panel   Other Visit Diagnoses  Ganglion cyst of volar aspect of right wrist       Relevant Orders   Ambulatory referral to Orthopedic Surgery       Return in about 6 months (around 12/06/2022), or if symptoms worsen or fail to improve.  Due for physical in 6 months.  Information was given about a ganglion cyst.  Referral to hand surgeon for possible I&D.  Continue Aldactone, amlodipine and olmesartan for blood pressure as it is well controlled with these medications.  Information was given also on managing hypertension.  Discussed the importance of staying hydrated keeping blood pressure controlled for maintenance of kidney function.  Mliss Sax, MD

## 2022-06-08 LAB — BASIC METABOLIC PANEL
BUN: 17 mg/dL (ref 6–23)
CO2: 28 mEq/L (ref 19–32)
Calcium: 9.2 mg/dL (ref 8.4–10.5)
Chloride: 98 mEq/L (ref 96–112)
Creatinine, Ser: 1.14 mg/dL (ref 0.40–1.50)
GFR: 81.33 mL/min (ref >60.00)
Glucose, Bld: 61 mg/dL — ABNORMAL LOW (ref 70–99)
Potassium: 3.8 mEq/L (ref 3.5–5.1)
Sodium: 135 mEq/L (ref 135–145)

## 2022-06-30 DIAGNOSIS — M67431 Ganglion, right wrist: Secondary | ICD-10-CM | POA: Diagnosis not present

## 2022-06-30 DIAGNOSIS — M25531 Pain in right wrist: Secondary | ICD-10-CM | POA: Diagnosis not present

## 2022-07-18 ENCOUNTER — Other Ambulatory Visit: Payer: Self-pay | Admitting: Family Medicine

## 2022-07-18 DIAGNOSIS — J309 Allergic rhinitis, unspecified: Secondary | ICD-10-CM

## 2022-07-27 ENCOUNTER — Encounter: Payer: BC Managed Care – PPO | Admitting: Family Medicine

## 2022-07-27 ENCOUNTER — Telehealth: Payer: Self-pay | Admitting: Family Medicine

## 2022-07-27 NOTE — Telephone Encounter (Signed)
2nd no show, rescheduled for 08/06/22, fee generated, final warning sent via mail & mychart

## 2022-07-27 NOTE — Telephone Encounter (Signed)
Pt was a no show for a cpe with Dr. Ethelene Hal on 07/27/22, I sent a letter.

## 2022-08-06 ENCOUNTER — Ambulatory Visit (INDEPENDENT_AMBULATORY_CARE_PROVIDER_SITE_OTHER): Payer: BC Managed Care – PPO | Admitting: Family Medicine

## 2022-08-06 ENCOUNTER — Encounter: Payer: Self-pay | Admitting: Family Medicine

## 2022-08-06 VITALS — BP 136/80 | Temp 98.0°F | Ht 70.0 in | Wt 204.4 lb

## 2022-08-06 DIAGNOSIS — I10 Essential (primary) hypertension: Secondary | ICD-10-CM

## 2022-08-06 DIAGNOSIS — Z0000 Encounter for general adult medical examination without abnormal findings: Secondary | ICD-10-CM

## 2022-08-06 NOTE — Progress Notes (Signed)
Established Patient Office Visit  Subjective   Patient ID: Brandon Bowman, male    DOB: August 15, 1981  Age: 41 y.o. MRN: [id number]  Chief Complaint  Patient presents with   Annual Exam    CPE, no concerns. Patient not fasting.     HPI here for physical exam and follow-up of his hypertension.  Blood pressure at home runs in the 120s over 70s to low 80s.  He continues with Aldactone 25 mg, olmesartan 20 mg and amlodipine 10 mg daily.  He has access to dental care but needs to find a new dentist.  He is active on his job.  Things are okay at home.  His 22 year old son enjoys playing soccer and tennis.    Review of Systems  Constitutional: Negative.   HENT: Negative.    Eyes:  Negative for blurred vision, discharge and redness.  Respiratory: Negative.    Cardiovascular: Negative.   Gastrointestinal:  Negative for abdominal pain.  Genitourinary: Negative.   Musculoskeletal: Negative.  Negative for myalgias.  Skin:  Negative for rash.  Neurological:  Negative for tingling, loss of consciousness and weakness.  Endo/Heme/Allergies:  Negative for polydipsia.      Objective:     BP 136/80 (BP Location: Right Arm, Patient Position: Sitting, Cuff Size: Normal)   Temp 98 F (36.7 C) (Temporal)   Ht 5\' 10"  (1.778 m)   Wt 204 lb 6.4 oz (92.7 kg)   BMI 29.33 kg/m    Physical Exam Constitutional:      General: He is not in acute distress.    Appearance: Normal appearance. He is not ill-appearing, toxic-appearing or diaphoretic.  HENT:     Head: Normocephalic and atraumatic.     Right Ear: External ear normal.     Left Ear: External ear normal.     Mouth/Throat:     Mouth: Mucous membranes are moist.     Pharynx: Oropharynx is clear. No oropharyngeal exudate or posterior oropharyngeal erythema.  Eyes:     General: No scleral icterus.       Right eye: No discharge.        Left eye: No discharge.     Extraocular Movements: Extraocular movements intact.      Conjunctiva/sclera: Conjunctivae normal.     Pupils: Pupils are equal, round, and reactive to light.  Cardiovascular:     Rate and Rhythm: Normal rate and regular rhythm.  Pulmonary:     Effort: Pulmonary effort is normal. No respiratory distress.     Breath sounds: Normal breath sounds.  Abdominal:     General: Bowel sounds are normal. There is no distension.     Tenderness: There is no abdominal tenderness. There is no guarding.     Hernia: There is no hernia in the left inguinal area or right inguinal area.  Genitourinary:    Penis: Circumcised. No hypospadias, erythema, tenderness, discharge, swelling or lesions.      Testes:        Right: Mass, tenderness or swelling not present. Right testis is descended.        Left: Mass, tenderness or swelling not present. Left testis is descended.     Epididymis:     Right: Not inflamed or enlarged.     Left: Not inflamed or enlarged.  Musculoskeletal:     Cervical back: No rigidity or tenderness.  Lymphadenopathy:     Lower Body: No right inguinal adenopathy. No left inguinal adenopathy.  Skin:    General: Skin is  warm and dry.  Neurological:     Mental Status: He is alert and oriented to person, place, and time.  Psychiatric:        Mood and Affect: Mood normal.        Behavior: Behavior normal.      No results found for any visits on 08/06/22.    The ASCVD Risk score (Arnett DK, et al., 2019) failed to calculate for the following reasons:   The 2019 ASCVD risk score is only valid for ages 63 to 48    Assessment & Plan:   Problem List Items Addressed This Visit       Cardiovascular and Mediastinum   Essential hypertension - Primary   Relevant Orders   CBC   Comprehensive metabolic panel   Other Visit Diagnoses     Healthcare maintenance       Relevant Orders   Urinalysis, Routine w reflex microscopic   Lipid panel       Return in about 1 year (around 08/07/2023), or if symptoms worsen or fail to improve.   Return fasting for blood work ordered blood work.  Be sure to schedule dental care.  Asked him to be mindful of sodium and alcohol intake.  Information was also given on managing hypertension.  Continue all medications as above.  Information was given on health maintenance and preventative care.  Mliss Sax, MD

## 2022-08-30 ENCOUNTER — Other Ambulatory Visit: Payer: BC Managed Care – PPO

## 2023-01-20 ENCOUNTER — Other Ambulatory Visit: Payer: Self-pay | Admitting: Family Medicine

## 2023-01-20 DIAGNOSIS — J309 Allergic rhinitis, unspecified: Secondary | ICD-10-CM

## 2023-04-21 ENCOUNTER — Other Ambulatory Visit: Payer: Self-pay | Admitting: Family Medicine

## 2023-04-21 DIAGNOSIS — I10 Essential (primary) hypertension: Secondary | ICD-10-CM

## 2023-04-26 DIAGNOSIS — Z302 Encounter for sterilization: Secondary | ICD-10-CM | POA: Diagnosis not present

## 2023-05-01 ENCOUNTER — Other Ambulatory Visit: Payer: Self-pay | Admitting: Family Medicine

## 2023-05-01 DIAGNOSIS — I10 Essential (primary) hypertension: Secondary | ICD-10-CM

## 2023-05-16 IMAGING — DX DG HIP (WITH OR WITHOUT PELVIS) 2-3V*L*
2 series · 2 of 2 positions shown · non-contrast
Comparison: None.

CLINICAL DATA: Left groin pain.  Left buttock pain.

EXAM:
DG HIP (WITH OR WITHOUT PELVIS) 2-3V LEFT

[pelvis ap]
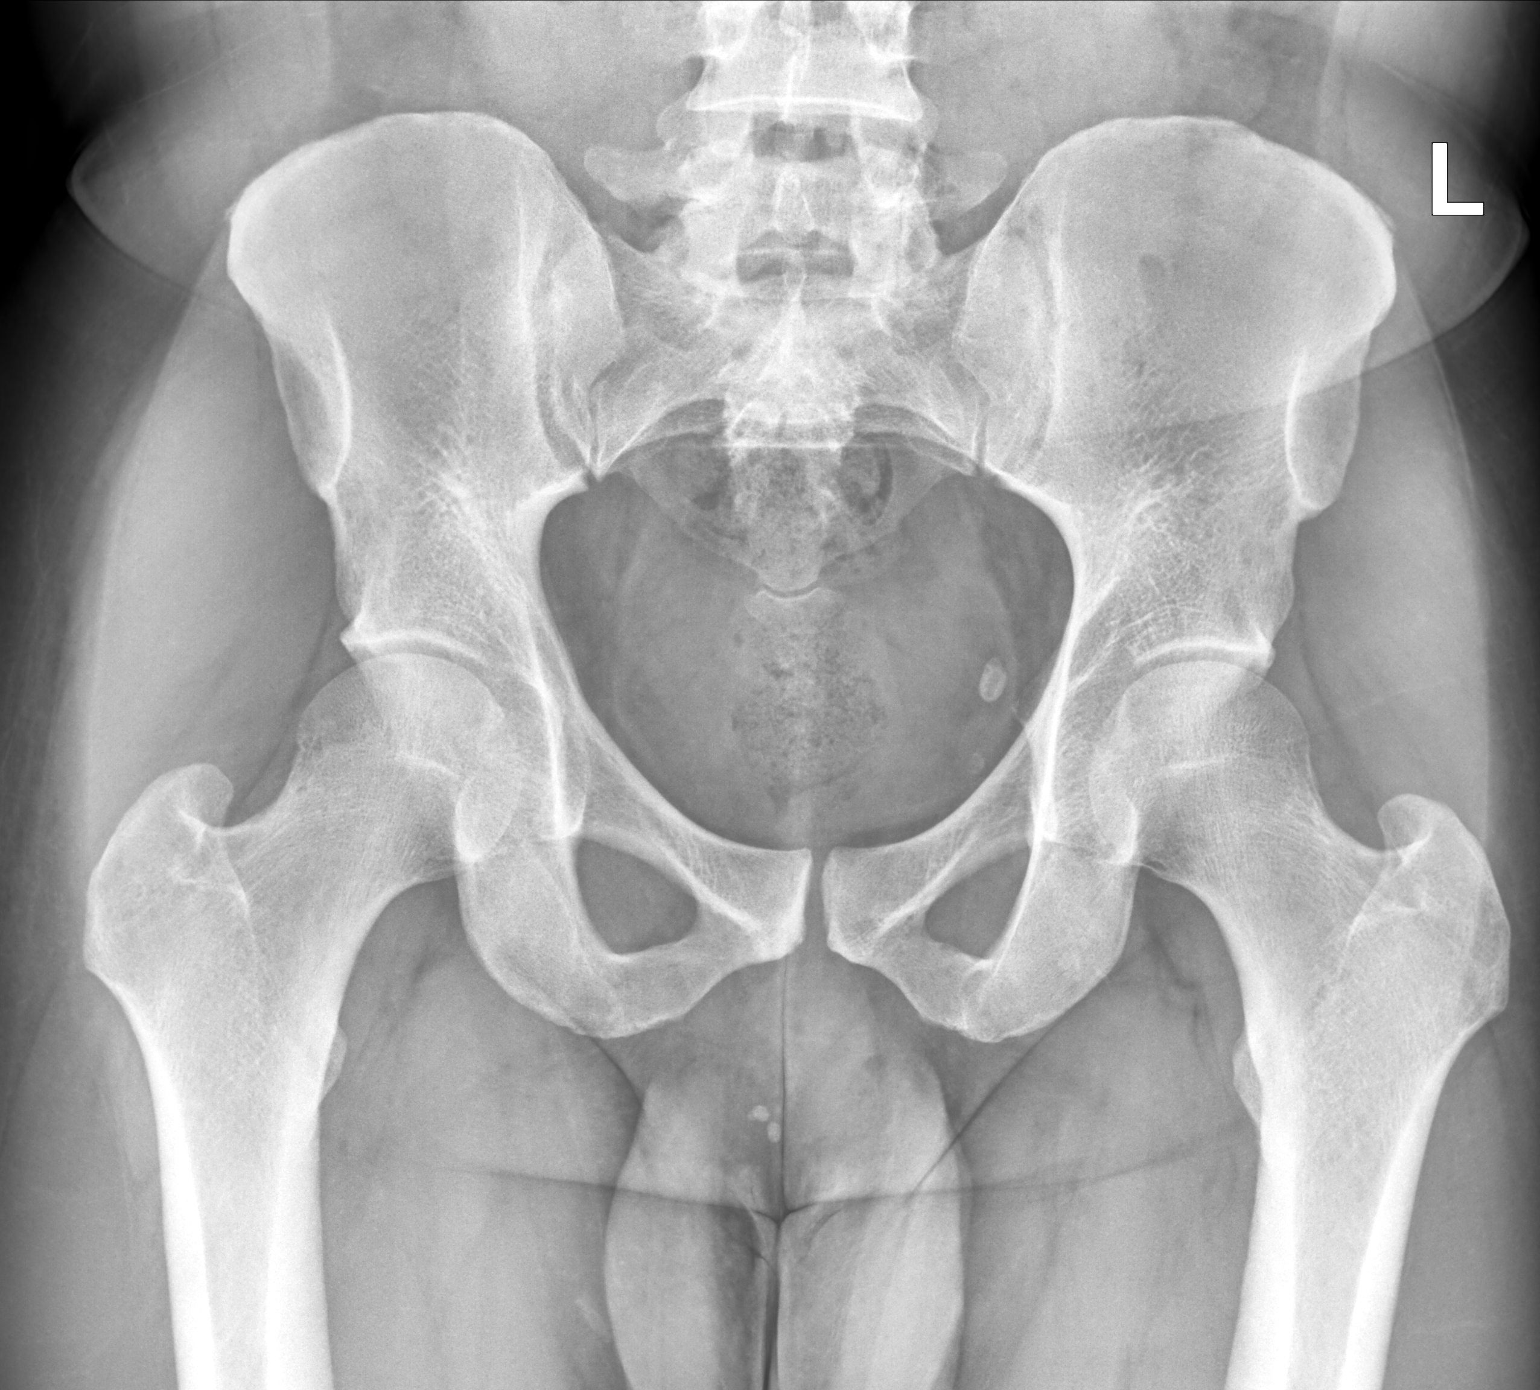

[hip frog leg lat]
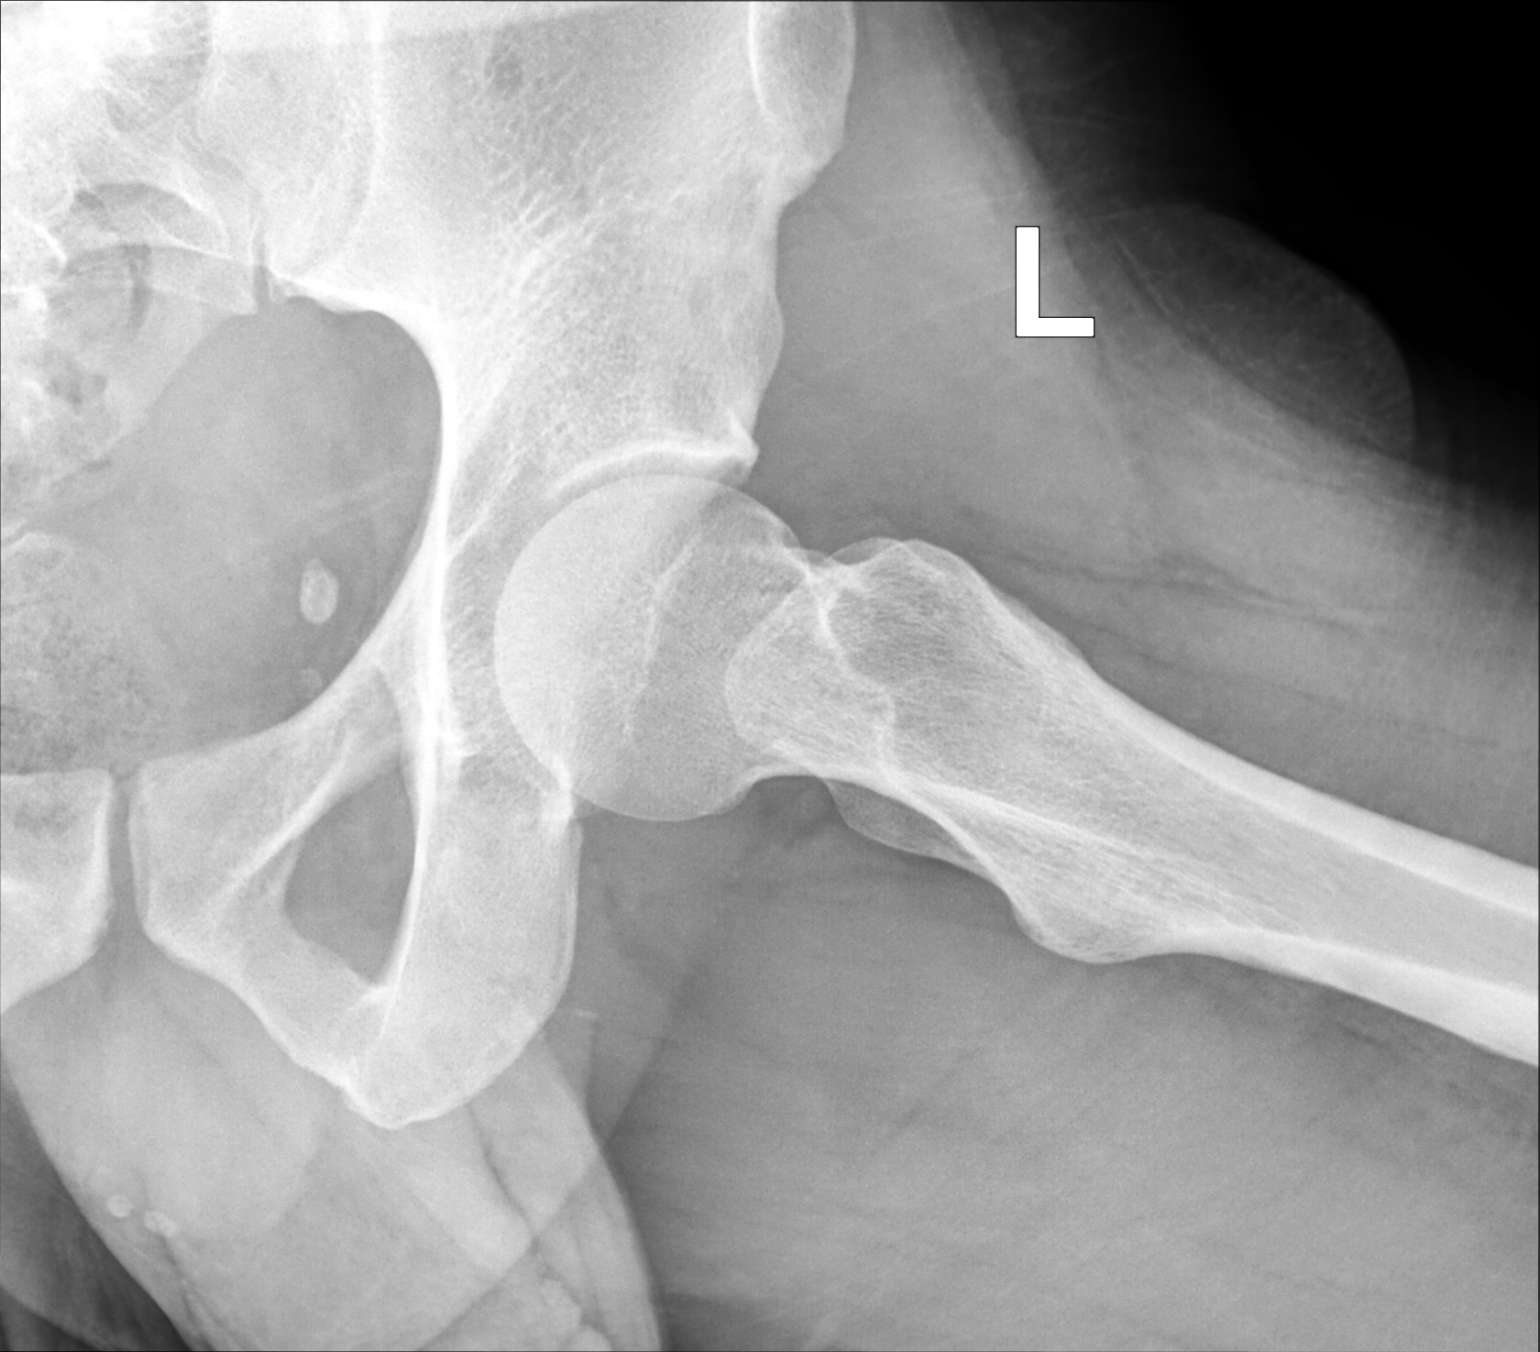

[2 of 2 positions shown; findings below may reference images not displayed]

FINDINGS: Normal bone mineralization. The bilateral sacroiliac common
bilateral femoroacetabular and pubic symphysis joint spaces are
maintained. Normal morphology of the left femoral head-neck junction
without CAM-type bump deformity. No acute fracture or dislocation.
Vascular phleboliths overlie the left hemipelvis and the superior
aspect of the perineum/scrotum.
IMPRESSION: Normal left hip radiographs.

## 2023-06-16 DIAGNOSIS — Z302 Encounter for sterilization: Secondary | ICD-10-CM | POA: Diagnosis not present

## 2023-07-17 ENCOUNTER — Other Ambulatory Visit: Payer: Self-pay | Admitting: Family Medicine

## 2023-07-17 DIAGNOSIS — J309 Allergic rhinitis, unspecified: Secondary | ICD-10-CM

## 2023-07-22 ENCOUNTER — Other Ambulatory Visit: Payer: Self-pay | Admitting: Family Medicine

## 2023-07-22 DIAGNOSIS — I10 Essential (primary) hypertension: Secondary | ICD-10-CM

## 2023-07-28 ENCOUNTER — Other Ambulatory Visit: Payer: Self-pay

## 2023-07-28 ENCOUNTER — Telehealth: Payer: Self-pay | Admitting: Family Medicine

## 2023-07-28 DIAGNOSIS — I10 Essential (primary) hypertension: Secondary | ICD-10-CM

## 2023-07-28 MED ORDER — SPIRONOLACTONE 25 MG PO TABS
25.0000 mg | ORAL_TABLET | Freq: Every day | ORAL | 0 refills | Status: DC
Start: 2023-07-28 — End: 2023-10-24

## 2023-07-28 NOTE — Telephone Encounter (Signed)
error 

## 2023-07-28 NOTE — Telephone Encounter (Signed)
Patient called in and spoke to the front about needing a refill on his Spirolactone 25 mg.   LR  06/12/22, #90, 3 rf LOV 08/06/22 FOV 08/08/23  Rx sent to the pharmacy and patient notified VIA phone. Dm/cma

## 2023-08-01 ENCOUNTER — Ambulatory Visit: Payer: BC Managed Care – PPO | Admitting: Family Medicine

## 2023-08-08 ENCOUNTER — Ambulatory Visit (INDEPENDENT_AMBULATORY_CARE_PROVIDER_SITE_OTHER): Payer: BC Managed Care – PPO | Admitting: Family Medicine

## 2023-08-08 ENCOUNTER — Encounter: Payer: Self-pay | Admitting: Family Medicine

## 2023-08-08 VITALS — BP 124/78 | HR 74 | Temp 98.3°F | Ht 70.0 in | Wt 214.8 lb

## 2023-08-08 DIAGNOSIS — Z13220 Encounter for screening for lipoid disorders: Secondary | ICD-10-CM

## 2023-08-08 DIAGNOSIS — I10 Essential (primary) hypertension: Secondary | ICD-10-CM

## 2023-08-08 DIAGNOSIS — Z0000 Encounter for general adult medical examination without abnormal findings: Secondary | ICD-10-CM | POA: Diagnosis not present

## 2023-08-08 DIAGNOSIS — Z131 Encounter for screening for diabetes mellitus: Secondary | ICD-10-CM

## 2023-08-08 NOTE — Progress Notes (Signed)
Established Patient Office Visit   Subjective:  Patient ID: Brandon Bowman, male    DOB: 05/08/1981  Age: 42 y.o. MRN: [id number]  Chief Complaint  Patient presents with   Annual Exam    CPE. Pt is not fasting. Wants to come back in morning for blood work.     HPI Encounter Diagnoses  Name Primary?   Healthcare maintenance Yes   Essential hypertension    Screening for cholesterol level    Screening for diabetes mellitus    In for physical and follow-up of hypertension.  Blood pressure well-controlled with olmesartan 20, amlodipine 10 and Aldactone 25.  He has been doing well.  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  He does not have regular dental care.   Review of Systems  Constitutional: Negative.   HENT: Negative.    Eyes:  Negative for blurred vision, discharge and redness.  Respiratory: Negative.    Cardiovascular: Negative.   Gastrointestinal:  Negative for abdominal pain.  Genitourinary: Negative.   Musculoskeletal: Negative.  Negative for myalgias.  Skin:  Negative for rash.  Neurological:  Negative for tingling, loss of consciousness and weakness.  Endo/Heme/Allergies:  Negative for polydipsia.     Current Outpatient Medications:    amLODipine (NORVASC) 10 MG tablet, TAKE 1 TABLET BY MOUTH EVERY DAY, Disp: 90 tablet, Rfl: 2   levocetirizine (XYZAL) 5 MG tablet, TAKE 1 TABLET BY MOUTH EVERY DAY IN THE EVENING, Disp: 90 tablet, Rfl: 0   olmesartan (BENICAR) 20 MG tablet, TAKE 1 TABLET BY MOUTH EVERY DAY, Disp: 90 tablet, Rfl: 2   spironolactone (ALDACTONE) 25 MG tablet, Take 1 tablet (25 mg total) by mouth daily., Disp: 90 tablet, Rfl: 0   Objective:     BP 124/78   Pulse 74   Temp 98.3 F (36.8 C)   Ht 5\' 10"  (1.778 m)   Wt 214 lb 12.8 oz (97.4 kg)   SpO2 97%   BMI 30.82 kg/m    Physical Exam Constitutional:      General: He is not in acute distress.    Appearance: Normal appearance. He is not ill-appearing, toxic-appearing or diaphoretic.   HENT:     Head: Normocephalic and atraumatic.     Right Ear: Tympanic membrane, ear canal and external ear normal.     Left Ear: Tympanic membrane, ear canal and external ear normal.     Mouth/Throat:     Mouth: Mucous membranes are moist.     Pharynx: Oropharynx is clear. No oropharyngeal exudate or posterior oropharyngeal erythema.  Eyes:     General: No scleral icterus.       Right eye: No discharge.        Left eye: No discharge.     Extraocular Movements: Extraocular movements intact.     Conjunctiva/sclera: Conjunctivae normal.     Pupils: Pupils are equal, round, and reactive to light.  Cardiovascular:     Rate and Rhythm: Normal rate and regular rhythm.  Pulmonary:     Effort: Pulmonary effort is normal. No respiratory distress.     Breath sounds: Normal breath sounds. No wheezing, rhonchi or rales.  Abdominal:     General: Bowel sounds are normal.     Tenderness: There is no abdominal tenderness. There is no guarding or rebound.  Musculoskeletal:     Cervical back: No rigidity or tenderness.  Skin:    General: Skin is warm and dry.  Neurological:     Mental Status:  He is alert and oriented to person, place, and time.  Psychiatric:        Mood and Affect: Mood normal.        Behavior: Behavior normal.      No results found for any visits on 08/08/23.    The 10-year ASCVD risk score (Arnett DK, et al., 2019) is: 8.8%    Assessment & Plan:   Healthcare maintenance  Essential hypertension -     CBC with Differential/Platelet; Future -     Comprehensive metabolic panel; Future -     Urinalysis, Routine w reflex microscopic; Future  Screening for cholesterol level -     Comprehensive metabolic panel; Future -     Lipid panel; Future  Screening for diabetes mellitus -     Comprehensive metabolic panel; Future -     Hemoglobin A1c; Future    Return in about 6 months (around 02/05/2024), or Return fasting in a.m. for ordered blood work as planned., for  chronic disease follow-up.  Given information on managing hypertension.  Information given on health maintenance and disease prevention.  Encouraged regular dental care.   Mliss Sax, MD

## 2023-08-09 ENCOUNTER — Other Ambulatory Visit (INDEPENDENT_AMBULATORY_CARE_PROVIDER_SITE_OTHER): Payer: BC Managed Care – PPO

## 2023-08-09 DIAGNOSIS — Z131 Encounter for screening for diabetes mellitus: Secondary | ICD-10-CM | POA: Diagnosis not present

## 2023-08-09 DIAGNOSIS — Z13220 Encounter for screening for lipoid disorders: Secondary | ICD-10-CM

## 2023-08-09 DIAGNOSIS — I10 Essential (primary) hypertension: Secondary | ICD-10-CM

## 2023-08-09 LAB — COMPREHENSIVE METABOLIC PANEL
ALT: 17 U/L (ref 0–53)
AST: 22 U/L (ref 0–37)
Albumin: 4.6 g/dL (ref 3.5–5.2)
Alkaline Phosphatase: 63 U/L (ref 39–117)
BUN: 15 mg/dL (ref 6–23)
CO2: 28 meq/L (ref 19–32)
Calcium: 9.2 mg/dL (ref 8.4–10.5)
Chloride: 101 meq/L (ref 96–112)
Creatinine, Ser: 1.17 mg/dL (ref 0.40–1.50)
GFR: 78.19 mL/min (ref >60.00)
Glucose, Bld: 90 mg/dL (ref 70–99)
Potassium: 4.1 meq/L (ref 3.5–5.1)
Sodium: 135 meq/L (ref 135–145)
Total Bilirubin: 0.4 mg/dL (ref 0.2–1.2)
Total Protein: 7.3 g/dL (ref 6.0–8.3)

## 2023-08-09 LAB — CBC WITH DIFFERENTIAL/PLATELET
Basophils Absolute: 0.1 10*3/uL (ref 0.0–0.1)
Basophils Relative: 1.3 % (ref 0.0–3.0)
Eosinophils Absolute: 0.2 10*3/uL (ref 0.0–0.7)
Eosinophils Relative: 4.3 % (ref 0.0–5.0)
HCT: 40.1 % (ref 39.0–52.0)
Hemoglobin: 13.3 g/dL (ref 13.0–17.0)
Lymphocytes Relative: 33.5 % (ref 12.0–46.0)
Lymphs Abs: 1.5 10*3/uL (ref 0.7–4.0)
MCHC: 33.2 g/dL (ref 30.0–36.0)
MCV: 84.7 fL (ref 78.0–100.0)
Monocytes Absolute: 0.4 10*3/uL (ref 0.1–1.0)
Monocytes Relative: 9.6 % (ref 3.0–12.0)
Neutro Abs: 2.4 10*3/uL (ref 1.4–7.7)
Neutrophils Relative %: 51.3 % (ref 43.0–77.0)
Platelets: 258 10*3/uL (ref 150.0–400.0)
RBC: 4.74 Mil/uL (ref 4.22–5.81)
RDW: 14 % (ref 11.5–15.5)
WBC: 4.6 10*3/uL (ref 4.0–10.5)

## 2023-08-09 LAB — URINALYSIS, ROUTINE W REFLEX MICROSCOPIC
Bilirubin Urine: NEGATIVE
Hgb urine dipstick: NEGATIVE
Ketones, ur: NEGATIVE
Leukocytes,Ua: NEGATIVE
Nitrite: NEGATIVE
RBC / HPF: NONE SEEN (ref 0–?)
Specific Gravity, Urine: 1.01 (ref 1.000–1.030)
Total Protein, Urine: NEGATIVE
Urine Glucose: NEGATIVE
Urobilinogen, UA: 0.2 (ref 0.0–1.0)
WBC, UA: NONE SEEN (ref 0–?)
pH: 7 (ref 5.0–8.0)

## 2023-08-09 LAB — LIPID PANEL
Cholesterol: 194 mg/dL (ref 0–200)
HDL: 46.3 mg/dL (ref >39.00)
LDL Cholesterol: 124 mg/dL — ABNORMAL HIGH (ref 0–99)
NonHDL: 148.07
Total CHOL/HDL Ratio: 4
Triglycerides: 120 mg/dL (ref 0.0–149.0)
VLDL: 24 mg/dL (ref 0.0–40.0)

## 2023-08-09 LAB — HEMOGLOBIN A1C: Hgb A1c MFr Bld: 6.1 % (ref 4.6–6.5)

## 2023-10-22 ENCOUNTER — Other Ambulatory Visit: Payer: Self-pay | Admitting: Family Medicine

## 2023-10-22 DIAGNOSIS — J309 Allergic rhinitis, unspecified: Secondary | ICD-10-CM

## 2023-10-23 ENCOUNTER — Other Ambulatory Visit: Payer: Self-pay | Admitting: Family Medicine

## 2023-10-23 DIAGNOSIS — I10 Essential (primary) hypertension: Secondary | ICD-10-CM

## 2024-01-27 ENCOUNTER — Other Ambulatory Visit: Payer: Self-pay | Admitting: Family Medicine

## 2024-01-27 DIAGNOSIS — I10 Essential (primary) hypertension: Secondary | ICD-10-CM

## 2024-01-27 DIAGNOSIS — J309 Allergic rhinitis, unspecified: Secondary | ICD-10-CM

## 2024-02-09 ENCOUNTER — Ambulatory Visit: Admitting: Family Medicine

## 2024-02-09 VITALS — BP 120/80 | HR 73 | Temp 97.7°F | Ht 70.0 in | Wt 206.0 lb

## 2024-02-09 DIAGNOSIS — Z13220 Encounter for screening for lipoid disorders: Secondary | ICD-10-CM

## 2024-02-09 DIAGNOSIS — I10 Essential (primary) hypertension: Secondary | ICD-10-CM

## 2024-02-09 DIAGNOSIS — R7303 Prediabetes: Secondary | ICD-10-CM | POA: Insufficient documentation

## 2024-02-09 NOTE — Progress Notes (Signed)
 Established Patient Office Visit   Subjective:  Patient ID: Brandon Bowman, male    DOB: [birth year]  Age: 43 y.o. MRN: [id number]  Chief Complaint  Patient presents with   Hypertension    F/u; being good, no concerns    Hypertension Pertinent negatives include no blurred vision.   Encounter Diagnoses  Name Primary?   Essential hypertension Yes   Prediabetes    Screening for cholesterol level    Follow-up of above.  Blood pressure well-controlled with amlodipine , olmesartan  and Aldactone .  No issues taking the medications.  He has been able to lose about 8 pounds.  No family history of diabetes in his parents.   Review of Systems  Constitutional: Negative.   HENT: Negative.    Eyes:  Negative for blurred vision, discharge and redness.  Respiratory: Negative.    Cardiovascular: Negative.   Gastrointestinal:  Negative for abdominal pain.  Genitourinary: Negative.   Musculoskeletal: Negative.  Negative for myalgias.  Skin:  Negative for rash.  Neurological:  Negative for tingling, loss of consciousness and weakness.  Endo/Heme/Allergies:  Negative for polydipsia.     Current Outpatient Medications:    amLODipine  (NORVASC ) 10 MG tablet, TAKE 1 TABLET BY MOUTH EVERY DAY, Disp: 30 tablet, Rfl: 0   levocetirizine (XYZAL ) 5 MG tablet, TAKE 1 TABLET BY MOUTH EVERY DAY IN THE EVENING, Disp: 30 tablet, Rfl: 0   olmesartan  (BENICAR ) 20 MG tablet, TAKE 1 TABLET BY MOUTH EVERY DAY, Disp: 30 tablet, Rfl: 0   spironolactone  (ALDACTONE ) 25 MG tablet, TAKE 1 TABLET (25 MG TOTAL) BY MOUTH DAILY., Disp: 30 tablet, Rfl: 0   Objective:     BP 120/80   Pulse 73   Temp 97.7 F (36.5 C) (Temporal)   Ht 5\' 10"  (1.778 m)   Wt 206 lb (93.4 kg)   SpO2 95%   BMI 29.56 kg/m  BP Readings from Last 3 Encounters:  02/09/24 120/80  08/08/23 124/78  08/06/22 136/80   Wt Readings from Last 3 Encounters:  02/09/24 206 lb (93.4 kg)  08/08/23 214 lb 12.8 oz (97.4 kg)  08/06/22 204 lb 6.4  oz (92.7 kg)      Physical Exam Constitutional:      General: He is not in acute distress.    Appearance: Normal appearance. He is not ill-appearing, toxic-appearing or diaphoretic.  HENT:     Head: Normocephalic and atraumatic.     Right Ear: External ear normal.     Left Ear: External ear normal.     Mouth/Throat:     Mouth: Mucous membranes are moist.     Pharynx: Oropharynx is clear. No oropharyngeal exudate or posterior oropharyngeal erythema.  Eyes:     General: No scleral icterus.       Right eye: No discharge.        Left eye: No discharge.     Extraocular Movements: Extraocular movements intact.     Conjunctiva/sclera: Conjunctivae normal.     Pupils: Pupils are equal, round, and reactive to light.  Cardiovascular:     Rate and Rhythm: Normal rate and regular rhythm.  Pulmonary:     Effort: Pulmonary effort is normal. No respiratory distress.     Breath sounds: Normal breath sounds. No wheezing or rales.  Abdominal:     General: Bowel sounds are normal.  Musculoskeletal:     Cervical back: No rigidity or tenderness.  Lymphadenopathy:     Cervical: No cervical adenopathy.  Skin:    General:  Skin is warm and dry.  Neurological:     Mental Status: He is alert and oriented to person, place, and time.  Psychiatric:        Mood and Affect: Mood normal.        Behavior: Behavior normal.      No results found for any visits on 02/09/24.    The 10-year ASCVD risk score (Arnett DK, et al., 2019) is: 7.9%    Assessment & Plan:   Essential hypertension -     CBC -     Comprehensive metabolic panel with GFR  Prediabetes -     Comprehensive metabolic panel with GFR -     Hemoglobin A1c -     Urinalysis, Routine w reflex microscopic  Screening for cholesterol level -     Comprehensive metabolic panel with GFR -     Lipid panel    Return in about 6 months (around 08/11/2024).  Continue weight loss efforts.  Information was given on prediabetes.  Continue  current blood pressure medications.  Information was given on the Prevnar vaccine.  Tonna Frederic, MD

## 2024-02-10 ENCOUNTER — Ambulatory Visit: Payer: Self-pay | Admitting: Family Medicine

## 2024-02-10 LAB — URINALYSIS, ROUTINE W REFLEX MICROSCOPIC
Bilirubin Urine: NEGATIVE
Hgb urine dipstick: NEGATIVE
Ketones, ur: NEGATIVE
Leukocytes,Ua: NEGATIVE
Nitrite: NEGATIVE
RBC / HPF: NONE SEEN (ref 0–?)
Specific Gravity, Urine: <=1.005 — AB (ref 1.000–1.030)
Total Protein, Urine: NEGATIVE
Urine Glucose: NEGATIVE
Urobilinogen, UA: 0.2 (ref 0.0–1.0)
pH: 6.5 (ref 5.0–8.0)

## 2024-02-10 LAB — COMPREHENSIVE METABOLIC PANEL WITH GFR
ALT: 16 U/L (ref 0–53)
AST: 21 U/L (ref 0–37)
Albumin: 4.5 g/dL (ref 3.5–5.2)
Alkaline Phosphatase: 61 U/L (ref 39–117)
BUN: 12 mg/dL (ref 6–23)
CO2: 29 meq/L (ref 19–32)
Calcium: 9.3 mg/dL (ref 8.4–10.5)
Chloride: 99 meq/L (ref 96–112)
Creatinine, Ser: 1.11 mg/dL (ref 0.40–1.50)
GFR: 82.99 mL/min (ref >60.00)
Glucose, Bld: 89 mg/dL (ref 70–99)
Potassium: 4 meq/L (ref 3.5–5.1)
Sodium: 136 meq/L (ref 135–145)
Total Bilirubin: 0.3 mg/dL (ref 0.2–1.2)
Total Protein: 7.6 g/dL (ref 6.0–8.3)

## 2024-02-10 LAB — CBC
HCT: 40.5 % (ref 39.0–52.0)
Hemoglobin: 13.4 g/dL (ref 13.0–17.0)
MCHC: 33.2 g/dL (ref 30.0–36.0)
MCV: 83.6 fl (ref 78.0–100.0)
Platelets: 276 10*3/uL (ref 150.0–400.0)
RBC: 4.84 Mil/uL (ref 4.22–5.81)
RDW: 13.2 % (ref 11.5–15.5)
WBC: 4.6 10*3/uL (ref 4.0–10.5)

## 2024-02-10 LAB — HEMOGLOBIN A1C: Hgb A1c MFr Bld: 6.2 % (ref 4.6–6.5)

## 2024-02-10 LAB — LIPID PANEL
Cholesterol: 188 mg/dL (ref 0–200)
HDL: 47.5 mg/dL (ref >39.00)
LDL Cholesterol: 84 mg/dL (ref 0–99)
NonHDL: 140.53
Total CHOL/HDL Ratio: 4
Triglycerides: 281 mg/dL — ABNORMAL HIGH (ref 0.0–149.0)
VLDL: 56.2 mg/dL — ABNORMAL HIGH (ref 0.0–40.0)

## 2024-02-23 ENCOUNTER — Other Ambulatory Visit: Payer: Self-pay | Admitting: Family Medicine

## 2024-02-23 DIAGNOSIS — I10 Essential (primary) hypertension: Secondary | ICD-10-CM

## 2024-02-26 ENCOUNTER — Other Ambulatory Visit: Payer: Self-pay | Admitting: Family Medicine

## 2024-02-26 DIAGNOSIS — I10 Essential (primary) hypertension: Secondary | ICD-10-CM

## 2024-02-26 DIAGNOSIS — J309 Allergic rhinitis, unspecified: Secondary | ICD-10-CM

## 2024-05-31 ENCOUNTER — Other Ambulatory Visit: Payer: Self-pay | Admitting: Family Medicine

## 2024-05-31 DIAGNOSIS — J309 Allergic rhinitis, unspecified: Secondary | ICD-10-CM

## 2024-08-29 ENCOUNTER — Other Ambulatory Visit: Payer: Self-pay | Admitting: Family Medicine

## 2024-08-29 DIAGNOSIS — I10 Essential (primary) hypertension: Secondary | ICD-10-CM

## 2024-08-29 DIAGNOSIS — J309 Allergic rhinitis, unspecified: Secondary | ICD-10-CM

## 2024-10-03 ENCOUNTER — Telehealth: Payer: Self-pay

## 2024-10-03 NOTE — Telephone Encounter (Addendum)
 Fax notification received from [facility] requesting prescription for: amLODipine  (NORVASC ) 10 MG tablet   olmesartan  (BENICAR ) 20 MG tablet  levocetirizine (XYZAL ) 5 MG tablet  spironolactone  (ALDACTONE ) 25 MG tablet  To be sent to [facility].  LRF 08/31/24, #90, 0RF. PCP indicated appt needed for RF. Medication RFs pended to 10/08/24 OV with Padonda Webb at [facility], start date 11/29/24, pharmacy on pended scrip updated.

## 2024-10-08 ENCOUNTER — Encounter: Payer: Self-pay | Admitting: Family

## 2024-10-08 ENCOUNTER — Ambulatory Visit: Admitting: Family

## 2024-10-08 VITALS — BP 138/80 | HR 72 | Ht 69.0 in | Wt 200.4 lb

## 2024-10-08 DIAGNOSIS — Z23 Encounter for immunization: Secondary | ICD-10-CM | POA: Diagnosis not present

## 2024-10-08 DIAGNOSIS — Z0000 Encounter for general adult medical examination without abnormal findings: Secondary | ICD-10-CM

## 2024-10-08 DIAGNOSIS — I10 Essential (primary) hypertension: Secondary | ICD-10-CM | POA: Diagnosis not present

## 2024-10-08 DIAGNOSIS — J309 Allergic rhinitis, unspecified: Secondary | ICD-10-CM

## 2024-10-08 MED ORDER — OLMESARTAN MEDOXOMIL 20 MG PO TABS: 20.0000 mg | ORAL_TABLET | Freq: Every day | ORAL | 3 refills | Status: AC

## 2024-10-08 MED ORDER — SPIRONOLACTONE 25 MG PO TABS: 25.0000 mg | ORAL_TABLET | Freq: Every day | ORAL | 3 refills | Status: AC

## 2024-10-08 MED ORDER — LEVOCETIRIZINE DIHYDROCHLORIDE 5 MG PO TABS: 5.0000 mg | ORAL_TABLET | Freq: Every evening | ORAL | 3 refills | Status: AC

## 2024-10-08 MED ORDER — AMLODIPINE BESYLATE 10 MG PO TABS: 10.0000 mg | ORAL_TABLET | Freq: Every day | ORAL | 3 refills | Status: AC

## 2024-10-08 NOTE — Progress Notes (Signed)
 "  Complete physical exam  Patient: Brandon Bowman   DOB: [birth year]   44 y.o. Male  MRN: [id number]  Subjective:    Chief Complaint  Patient presents with   Annual Exam    NOT FASTING     Brandon Bowman is a 44 y.o. male who presents today for a complete physical exam. He reports consuming a general diet. The patient has a physically strenuous job, but has no regular exercise apart from work.  He generally feels well. He reports sleeping well. He does not have additional problems to discuss today.  He works for [employer] and reports a lot of heavy lifting and cardio type exercise.Has seasonal allergies.  Has seen an allergist and offered injections but declined.  Reports sneezing more recently.  Has 5 children.  Reports aggravation is typically related to his youngest 2 being busy.  Oldest child is in college at [employer].   Most recent fall risk assessment:    10/08/2024    3:07 PM  Fall Risk   Falls in the past year? 0  Number falls in past yr: 0  Injury with Fall? 0  Risk for fall due to : No Fall Risks  Follow up Falls evaluation completed     Most recent depression screenings:    10/08/2024    3:07 PM 02/09/2024    3:36 PM  PHQ 2/9 Scores  PHQ - 2 Score 0 0  PHQ- 9 Score 0 0      Data saved with a previous flowsheet row definition        Patient Care Team: Berneta Elsie Sayre, MD as PCP - General (Family Medicine)   Show/hide medication list[1]  Review of Systems  HENT:  Negative for sinus pain and sore throat.        Sneezing  All other systems reviewed and are negative.         Objective:     BP 138/80 (BP Location: Left Arm, Patient Position: Sitting, Cuff Size: Large)   Pulse 72   Ht 5' 9 (1.753 m)   Wt 200 lb 6.4 oz (90.9 kg)   SpO2 97%   BMI 29.59 kg/m    Physical Exam Vitals reviewed.  Constitutional:      Appearance: Normal appearance. He is normal weight.  HENT:     Head: Normocephalic and atraumatic.     Right  Ear: Tympanic membrane, ear canal and external ear normal.     Left Ear: Tympanic membrane, ear canal and external ear normal.     Nose: Nose normal.     Mouth/Throat:     Mouth: Mucous membranes are moist.     Pharynx: Oropharynx is clear.  Eyes:     Extraocular Movements: Extraocular movements intact.     Conjunctiva/sclera: Conjunctivae normal.     Pupils: Pupils are equal, round, and reactive to light.  Cardiovascular:     Rate and Rhythm: Normal rate and regular rhythm.     Pulses: Normal pulses.     Heart sounds: Normal heart sounds.  Pulmonary:     Effort: Pulmonary effort is normal.     Breath sounds: Normal breath sounds.  Abdominal:     General: Abdomen is flat. Bowel sounds are normal.     Palpations: Abdomen is soft.  Musculoskeletal:        General: Normal range of motion.     Cervical back: Normal range of motion and neck supple.  Skin:  General: Skin is warm and dry.  Neurological:     General: No focal deficit present.     Mental Status: He is alert and oriented to person, place, and time. Mental status is at baseline.  Psychiatric:        Mood and Affect: Mood normal.        Behavior: Behavior normal.        Thought Content: Thought content normal.        Judgment: Judgment normal.      No results found for any visits on 10/08/24.     Assessment & Plan:    Routine Health Maintenance and Physical Exam  Immunization History  Administered Date(s) Administered   DTP 08/26/1983, 10/29/1983, 11/07/1984, 12/09/1985, 01/19/1988   MMR 11/07/1984   OPV 08/26/1983, 10/29/1983, 11/07/1984, 12/09/1985, 06/30/1988   PFIZER(Purple Top)SARS-COV-2 Vaccination 12/13/2019, 01/09/2020   Td 09/02/2014   Td (Adult),5 Lf Tetanus Toxid, Preservative Free 09/02/2014    Health Maintenance  Topic Date Due   Hepatitis C Screening  Never done   Pneumococcal Vaccine (1 of 2 - PCV) Never done   Hepatitis B Vaccines 19-59 Average Risk (1 of 3 - 19+ 3-dose series) Never  done   DTaP/Tdap/Td (8 - Tdap) 09/02/2024   Influenza Vaccine  12/18/2024 (Originally 04/20/2024)   HPV VACCINES (No Doses Required) Completed   HIV Screening  Completed   Meningococcal B Vaccine  Aged Out   COVID-19 Vaccine  Discontinued    Discussed health benefits of physical activity, and encouraged him to engage in regular exercise appropriate for his age and condition.  Problem List Items Addressed This Visit     Essential hypertension   Relevant Medications   amLODipine  (NORVASC ) 10 MG tablet (Start on 11/29/2024)   olmesartan  (BENICAR ) 20 MG tablet (Start on 11/29/2024)   spironolactone  (ALDACTONE ) 25 MG tablet (Start on 11/29/2024)   Other Relevant Orders   Comp Met (CMET)   Allergic rhinitis   Relevant Medications   levocetirizine (XYZAL ) 5 MG tablet (Start on 11/29/2024)   Other Visit Diagnoses       Routine adult health maintenance    -  Primary   Relevant Orders   Comp Met (CMET)   CBC w/Diff   TSH   Lipid panel     Need for Tdap vaccination       Relevant Orders   Tdap vaccine greater than or equal to 7yo IM     Need for pneumococcal 20-valent conjugate vaccination       Relevant Orders   Pneumococcal conjugate vaccine 20-valent (Prevnar 20)      Return in about 4 days (around 10/12/2024) for  labs and in 6 months. . Encouraged healthy diet and exercise.  Add Flonase to the Xyzal  if symptoms do not improved.  For now he will try taking it at bedtime.  Will return on Friday for fasting labs.  Also schedule 42-month follow-up for his blood pressure with PCP.    Rafel Garde B Waylyn Tenbrink, FNP      [1]  Outpatient Medications Prior to Visit  Medication Sig   [DISCONTINUED] amLODipine  (NORVASC ) 10 MG tablet TAKE 1 TABLET BY MOUTH EVERY DAY   [DISCONTINUED] levocetirizine (XYZAL ) 5 MG tablet TAKE 1 TABLET BY MOUTH EVERY DAY IN THE EVENING   [DISCONTINUED] olmesartan  (BENICAR ) 20 MG tablet TAKE 1 TABLET BY MOUTH EVERY DAY   [DISCONTINUED] spironolactone  (ALDACTONE ) 25 MG  tablet TAKE 1 TABLET (25 MG TOTAL) BY MOUTH DAILY.   No facility-administered medications  prior to visit.   "

## 2024-10-12 ENCOUNTER — Ambulatory Visit: Payer: Self-pay | Admitting: Family

## 2024-10-12 ENCOUNTER — Other Ambulatory Visit (INDEPENDENT_AMBULATORY_CARE_PROVIDER_SITE_OTHER)

## 2024-10-12 DIAGNOSIS — Z0000 Encounter for general adult medical examination without abnormal findings: Secondary | ICD-10-CM | POA: Diagnosis not present

## 2024-10-12 DIAGNOSIS — I10 Essential (primary) hypertension: Secondary | ICD-10-CM

## 2024-10-12 LAB — COMPREHENSIVE METABOLIC PANEL WITH GFR
ALT: 13 U/L (ref 3–53)
AST: 19 U/L (ref 5–37)
Albumin: 4.4 g/dL (ref 3.5–5.2)
Alkaline Phosphatase: 57 U/L (ref 39–117)
BUN: 11 mg/dL (ref 6–23)
CO2: 30 meq/L (ref 19–32)
Calcium: 9.4 mg/dL (ref 8.4–10.5)
Chloride: 101 meq/L (ref 96–112)
Creatinine, Ser: 1.1 mg/dL (ref 0.40–1.50)
GFR: 83.5 mL/min
Glucose, Bld: 92 mg/dL (ref 70–99)
Potassium: 4.3 meq/L (ref 3.5–5.1)
Sodium: 137 meq/L (ref 135–145)
Total Bilirubin: 0.4 mg/dL (ref 0.2–1.2)
Total Protein: 7.7 g/dL (ref 6.0–8.3)

## 2024-10-12 LAB — LIPID PANEL
Cholesterol: 186 mg/dL (ref 28–200)
HDL: 60.8 mg/dL
LDL Cholesterol: 116 mg/dL — ABNORMAL HIGH (ref 10–99)
NonHDL: 125.36
Total CHOL/HDL Ratio: 3
Triglycerides: 48 mg/dL (ref 10.0–149.0)
VLDL: 9.6 mg/dL (ref 0.0–40.0)

## 2024-10-12 LAB — TSH: TSH: 1 m[IU]/L (ref 0.40–4.50)

## 2024-10-12 NOTE — Addendum Note (Signed)
 Addended by: CLAUDENE DIXON B on: 10/12/2024 09:05 AM   Modules accepted: Orders

## 2025-04-11 ENCOUNTER — Ambulatory Visit: Admitting: Family Medicine
